# Patient Record
Sex: Male | Born: 1965 | Race: White | Hispanic: No | Marital: Married | Smoking: Never smoker
Health system: Southern US, Community
[De-identification: ages and names within clinical notes are randomized; demographics above are authoritative.]

## PROBLEM LIST (undated history)

## (undated) DIAGNOSIS — I10 Essential (primary) hypertension: Secondary | ICD-10-CM

## (undated) DIAGNOSIS — E039 Hypothyroidism, unspecified: Secondary | ICD-10-CM

## (undated) DIAGNOSIS — G4730 Sleep apnea, unspecified: Secondary | ICD-10-CM

## (undated) DIAGNOSIS — E785 Hyperlipidemia, unspecified: Secondary | ICD-10-CM

## (undated) DIAGNOSIS — J329 Chronic sinusitis, unspecified: Secondary | ICD-10-CM

## (undated) DIAGNOSIS — I080 Rheumatic disorders of both mitral and aortic valves: Secondary | ICD-10-CM

## (undated) DIAGNOSIS — D239 Other benign neoplasm of skin, unspecified: Secondary | ICD-10-CM

## (undated) DIAGNOSIS — I341 Nonrheumatic mitral (valve) prolapse: Secondary | ICD-10-CM

## (undated) DIAGNOSIS — L509 Urticaria, unspecified: Secondary | ICD-10-CM

## (undated) DIAGNOSIS — E0500 Thyrotoxicosis with diffuse goiter without thyrotoxic crisis or storm: Secondary | ICD-10-CM

## (undated) HISTORY — PX: NASAL SINUS SURGERY: SHX719

## (undated) HISTORY — PX: OTHER SURGICAL HISTORY: SHX169

---

## 2010-02-22 ENCOUNTER — Ambulatory Visit: Payer: Self-pay | Admitting: Otolaryngology

## 2016-04-14 ENCOUNTER — Other Ambulatory Visit: Payer: Self-pay | Admitting: Internal Medicine

## 2016-04-14 DIAGNOSIS — R1310 Dysphagia, unspecified: Secondary | ICD-10-CM

## 2016-10-17 ENCOUNTER — Other Ambulatory Visit: Payer: Self-pay | Admitting: Internal Medicine

## 2016-10-17 DIAGNOSIS — R1310 Dysphagia, unspecified: Secondary | ICD-10-CM

## 2016-11-10 ENCOUNTER — Ambulatory Visit
Admission: RE | Admit: 2016-11-10 | Discharge: 2016-11-10 | Disposition: A | Discharge status: Home / Self Care | Payer: 59 | Source: Ambulatory Visit | Attending: Internal Medicine | Admitting: Internal Medicine

## 2016-11-10 DIAGNOSIS — K449 Diaphragmatic hernia without obstruction or gangrene: Secondary | ICD-10-CM | POA: Insufficient documentation

## 2016-11-10 DIAGNOSIS — R1310 Dysphagia, unspecified: Secondary | ICD-10-CM | POA: Diagnosis not present

## 2017-03-15 ENCOUNTER — Encounter: Payer: Self-pay | Admitting: *Deleted

## 2017-03-16 ENCOUNTER — Encounter
Admission: RE | Disposition: A | Discharge status: Home / Self Care | Payer: Self-pay | Source: Ambulatory Visit | Attending: Gastroenterology

## 2017-03-16 ENCOUNTER — Encounter: Payer: Self-pay | Admitting: *Deleted

## 2017-03-16 ENCOUNTER — Ambulatory Visit
Admission: RE | Admit: 2017-03-16 | Discharge: 2017-03-16 | Disposition: A | Discharge status: Home / Self Care | Payer: Managed Care, Other (non HMO) | Source: Ambulatory Visit | Attending: Gastroenterology | Admitting: Gastroenterology

## 2017-03-16 ENCOUNTER — Ambulatory Visit: Payer: Managed Care, Other (non HMO) | Admitting: Anesthesiology

## 2017-03-16 DIAGNOSIS — R1310 Dysphagia, unspecified: Secondary | ICD-10-CM | POA: Insufficient documentation

## 2017-03-16 DIAGNOSIS — I10 Essential (primary) hypertension: Secondary | ICD-10-CM | POA: Diagnosis not present

## 2017-03-16 DIAGNOSIS — Z79899 Other long term (current) drug therapy: Secondary | ICD-10-CM | POA: Insufficient documentation

## 2017-03-16 DIAGNOSIS — E0500 Thyrotoxicosis with diffuse goiter without thyrotoxic crisis or storm: Secondary | ICD-10-CM | POA: Insufficient documentation

## 2017-03-16 DIAGNOSIS — E039 Hypothyroidism, unspecified: Secondary | ICD-10-CM | POA: Insufficient documentation

## 2017-03-16 DIAGNOSIS — Z1211 Encounter for screening for malignant neoplasm of colon: Secondary | ICD-10-CM | POA: Diagnosis not present

## 2017-03-16 DIAGNOSIS — Z7952 Long term (current) use of systemic steroids: Secondary | ICD-10-CM | POA: Diagnosis not present

## 2017-03-16 DIAGNOSIS — K449 Diaphragmatic hernia without obstruction or gangrene: Secondary | ICD-10-CM | POA: Insufficient documentation

## 2017-03-16 DIAGNOSIS — K5730 Diverticulosis of large intestine without perforation or abscess without bleeding: Secondary | ICD-10-CM | POA: Diagnosis not present

## 2017-03-16 DIAGNOSIS — I341 Nonrheumatic mitral (valve) prolapse: Secondary | ICD-10-CM | POA: Insufficient documentation

## 2017-03-16 HISTORY — DX: Other benign neoplasm of skin, unspecified: D23.9

## 2017-03-16 HISTORY — DX: Nonrheumatic mitral (valve) prolapse: I34.1

## 2017-03-16 HISTORY — DX: Hyperlipidemia, unspecified: E78.5

## 2017-03-16 HISTORY — DX: Thyrotoxicosis with diffuse goiter without thyrotoxic crisis or storm: E05.00

## 2017-03-16 HISTORY — PX: ESOPHAGOGASTRODUODENOSCOPY: SHX5428

## 2017-03-16 HISTORY — DX: Essential (primary) hypertension: I10

## 2017-03-16 HISTORY — DX: Hypothyroidism, unspecified: E03.9

## 2017-03-16 HISTORY — DX: Chronic sinusitis, unspecified: J32.9

## 2017-03-16 HISTORY — DX: Urticaria, unspecified: L50.9

## 2017-03-16 HISTORY — DX: Rheumatic disorders of both mitral and aortic valves: I08.0

## 2017-03-16 HISTORY — PX: COLONOSCOPY: SHX5424

## 2017-03-16 SURGERY — COLONOSCOPY
Anesthesia: General

## 2017-03-16 MED ORDER — GLYCOPYRROLATE 0.2 MG/ML IJ SOLN
INTRAMUSCULAR | Status: DC | PRN
  Administered 2017-03-16 (×2): 0.2 mg via INTRAVENOUS

## 2017-03-16 MED ORDER — SODIUM CHLORIDE 0.9 % IV SOLN: INTRAVENOUS | Status: DC

## 2017-03-16 MED ORDER — SODIUM CHLORIDE 0.9 % IV SOLN
INTRAVENOUS | Status: DC
  Administered 2017-03-16: 1000 mL via INTRAVENOUS

## 2017-03-16 MED ORDER — PROPOFOL 500 MG/50ML IV EMUL
INTRAVENOUS | Status: DC | PRN
  Administered 2017-03-16: 200 ug/kg/min via INTRAVENOUS

## 2017-03-16 MED ORDER — LIDOCAINE 2% (20 MG/ML) 5 ML SYRINGE
INTRAMUSCULAR | Status: DC | PRN
  Administered 2017-03-16: 100 mg via INTRAVENOUS

## 2017-03-16 MED ORDER — PROPOFOL 10 MG/ML IV BOLUS
INTRAVENOUS | Status: DC | PRN
  Administered 2017-03-16: 100 mg via INTRAVENOUS

## 2017-03-16 MED ORDER — PROPOFOL 500 MG/50ML IV EMUL
INTRAVENOUS | Status: AC
  Filled 2017-03-16: qty 50

## 2017-03-16 MED ORDER — FENTANYL CITRATE (PF) 100 MCG/2ML IJ SOLN: 25.0000 ug | INTRAMUSCULAR | Status: DC | PRN

## 2017-03-16 MED ORDER — PHENYLEPHRINE HCL 10 MG/ML IJ SOLN
INTRAMUSCULAR | Status: DC | PRN
  Administered 2017-03-16: 50 ug via INTRAVENOUS

## 2017-03-16 MED ORDER — ONDANSETRON HCL 4 MG/2ML IJ SOLN: 4.0000 mg | Freq: Once | INTRAMUSCULAR | Status: DC | PRN

## 2017-03-16 NOTE — Op Note (Signed)
[facility] Gastroenterology Patient Name: Jose Black Procedure Date: 03/16/2017 12:22 PM MRN: [id number] Account #: [id number] Date of Birth: 1966/02/10 Admit Type: Outpatient Age: 51 Room: [facility] ENDO ROOM 3 Gender: Male Note Status: Finalized Procedure:            Colonoscopy Indications:          Screening for colorectal malignant neoplasm Providers:            Lollie Sails, MD Referring MD:         Leonie Douglas. Doy Hutching, MD (Referring MD) Medicines:            Monitored Anesthesia Care Complications:        No immediate complications. Procedure:            Pre-Anesthesia Assessment:                       - ASA Grade Assessment: III - A patient with severe                        systemic disease.                       After obtaining informed consent, the colonoscope was                        passed under direct vision. Throughout the procedure,                        the patient's blood pressure, pulse, and oxygen                        saturations were monitored continuously. The                        Colonoscope was introduced through the anus and                        advanced to the the cecum, identified by appendiceal                        orifice and ileocecal valve. The colonoscopy was                        performed with moderate difficulty due to poor                        endoscopic visualization and a tortuous colon.                        Successful completion of the procedure was aided by                        changing the patient to a supine position, changing the                        patient to a prone position and lavage. The patient                        tolerated the procedure well. The quality of the bowel  preparation was fair. Findings:      Multiple small-mouthed diverticula were found in the sigmoid colon and       descending colon.      The digital rectal exam was normal.      The exam was  otherwise without abnormality. Impression:           - Preparation of the colon was fair.                       - Diverticulosis in the sigmoid colon and in the                        descending colon.                       - The examination was otherwise normal.                       - No specimens collected. Recommendation:       - Repeat colonoscopy in 10 years for screening purposes. Procedure Code(s):    --- Professional ---                       [phone], Colonoscopy, flexible; diagnostic, including                        collection of specimen(s) by brushing or washing, when                        performed (separate procedure) Diagnosis Code(s):    --- Professional ---                       Z12.11, Encounter for screening for malignant neoplasm                        of colon                       K57.30, Diverticulosis of large intestine without                        perforation or abscess without bleeding CPT copyright 2016 American [employer]. All rights reserved. The codes documented in this report are preliminary and upon coder review may  be revised to meet current compliance requirements. Lollie Sails, MD 03/16/2017 1:26:44 PM This report has been signed electronically. Number of Addenda: 0 Note Initiated On: 03/16/2017 12:22 PM Scope Withdrawal Time: 0 hours 11 minutes 36 seconds  Total Procedure Duration: 0 hours 40 minutes 55 seconds       [facility]

## 2017-03-16 NOTE — Transfer of Care (Signed)
Immediate Anesthesia Transfer of Care Note  Patient: Jose Black  Procedure(s) Performed: Procedure(s): COLONOSCOPY (N/A) ESOPHAGOGASTRODUODENOSCOPY (EGD) (N/A)  Patient Location: Endoscopy Unit  Anesthesia Type:General  Level of Consciousness: sedated  Airway & Oxygen Therapy: Patient connected to face mask oxygen  Post-op Assessment: Post -op Vital signs reviewed and stable  Post vital signs: stable  Last Vitals:  Vitals:   03/16/17 1143 03/16/17 1344  BP:  112/85  Pulse: 66 85  Resp: 20 12  Temp: 36.4 C (!) 35.9 C    Last Pain:  Vitals:   03/16/17 1344  TempSrc: Tympanic      Patients Stated Pain Goal: 0 (28/83/37 4451)  Complications: No apparent anesthesia complications

## 2017-03-16 NOTE — Anesthesia Post-op Follow-up Note (Cosign Needed)
Anesthesia QCDR form completed.        

## 2017-03-16 NOTE — H&P (Signed)
Outpatient short stay form Pre-procedure 03/16/2017 11:57 AM Lollie Sails MD  Primary Physician: Dr. Fulton Reek  Reason for visit:  Dysphagia, abnormal barium swallow, colon cancer screening.  History of present illness:  Patient is a 51 year old male presenting today as above. Patient has a long-term history of some mild intermittent dysphagia. He says "30 years". He will at times regurgitate foods. He had a barium swallow on 11/10/2016 showing a slight delay of passage in the standard barium tablet just above a hiatal hernia. Also tertiary contractions noted. As a small sliding hiatal hernia. He tolerated his prep well. He is also undergoing a screening colonoscopy today. This is his first colonoscopy. He takes no aspirin or blood thinning agents. Patient does have a history of sleep apnea and uses a CPAP.    Current Facility-Administered Medications:  .  0.9 %  sodium chloride infusion, , Intravenous, Continuous, Lollie Sails, MD, Last Rate: 20 mL/hr at 03/16/17 1155, 1,000 mL at 03/16/17 1155 .  0.9 %  sodium chloride infusion, , Intravenous, Continuous, Lollie Sails, MD .  fentaNYL (SUBLIMAZE) injection 25 mcg, 25 mcg, Intravenous, Q5 min PRN, Alvin Critchley, MD .  ondansetron [facility]) injection 4 mg, 4 mg, Intravenous, Once PRN, Alvin Critchley, MD  Prescriptions Prior to Admission  Medication Sig Dispense Refill Last Dose  . albuterol (PROVENTIL HFA;VENTOLIN HFA) 108 (90 Base) MCG/ACT inhaler Inhale into the lungs every 6 (six) hours as needed for wheezing or shortness of breath.     . levothyroxine (SYNTHROID, LEVOTHROID) 200 MCG tablet Take 200 mcg by mouth daily before breakfast.     . predniSONE (DELTASONE) 10 MG tablet Take 10 mg by mouth daily with breakfast.        No Known Allergies   Past Medical History:  Diagnosis Date  . Dysplastic nevi   . Elevated lipids   . Graves disease   . Hypertension   . Hypothyroidism   . Mitral and aortic regurgitation    . MVP (mitral valve prolapse)   . Sinusitis   . Urticaria     Review of systems:      Physical Exam    Heart and lungs: Regular rate and rhythm without rub or gallop, lungs are bilaterally clear.    HEENT: Normocephalic atraumatic eyes are anicteric    Other:     Pertinant exam for procedure: Soft nontender nondistended bowel sounds positive normoactive.    Planned proceedures: EGD, colonoscopy and indicated procedures. I have discussed the risks benefits and complications of procedures to include not limited to bleeding, infection, perforation and the risk of sedation and the patient wishes to proceed.    Lollie Sails, MD Gastroenterology 03/16/2017  11:57 AM

## 2017-03-16 NOTE — Anesthesia Preprocedure Evaluation (Addendum)
Anesthesia Evaluation  Patient identified by MRN, date of birth, ID band Patient awake    Reviewed: Allergy & Precautions, NPO status , Patient's Chart, lab work & pertinent test results  Airway Mallampati: II  TM Distance: <3 FB     Dental  (+) Chipped   Pulmonary neg pulmonary ROS,    Pulmonary exam normal        Cardiovascular hypertension, Normal cardiovascular exam+ Valvular Problems/Murmurs MVP, MR and AI      Neuro/Psych negative neurological ROS  negative psych ROS   GI/Hepatic negative GI ROS, Neg liver ROS,   Endo/Other  Hypothyroidism Hyperthyroidism   Renal/GU negative Renal ROS     Musculoskeletal negative musculoskeletal ROS (+)   Abdominal Normal abdominal exam  (+)   Peds  Hematology negative hematology ROS (+)   Anesthesia Other Findings   Reproductive/Obstetrics                            Anesthesia Physical Anesthesia Plan  ASA: III  Anesthesia Plan: General   Post-op Pain Management:    Induction: Intravenous  Airway Management Planned: Nasal Cannula  Additional Equipment:   Intra-op Plan:   Post-operative Plan:   Informed Consent: I have reviewed the patients History and Physical, chart, labs and discussed the procedure including the risks, benefits and alternatives for the proposed anesthesia with the patient or authorized representative who has indicated his/her understanding and acceptance.   Dental advisory given  Plan Discussed with: CRNA and Surgeon  Anesthesia Plan Comments:         Anesthesia Quick Evaluation

## 2017-03-16 NOTE — Op Note (Signed)
[facility] Gastroenterology Patient Name: Jose Black Procedure Date: 03/16/2017 12:23 PM MRN: [id number] Account #: [id number] Date of Birth: 10-Nov-1965 Admit Type: Outpatient Age: 51 Room: [facility] ENDO ROOM 3 Gender: Male Note Status: Finalized Procedure:            Upper GI endoscopy Indications:          Dysphagia Providers:            Lollie Sails, MD Referring MD:         Leonie Douglas. Doy Hutching, MD (Referring MD) Medicines:            Monitored Anesthesia Care Complications:        No immediate complications. Procedure:            Pre-Anesthesia Assessment:                       - ASA Grade Assessment: III - A patient with severe                        systemic disease.                       After obtaining informed consent, the endoscope was                        passed under direct vision. Throughout the procedure,                        the patient's blood pressure, pulse, and oxygen                        saturations were monitored continuously. The procedure                        was aborted. The scope was not inserted. Medications                        were given. The upper GI endoscopy was extremely                        difficult due to patient intolerance of esophageal                        intubation. Findings:      Initial attempt at esophageal intubation was unsuccessful due to hypoxia       and intolerance of intubation. There was noted a n amount of mucousy       material coughed out of the fairway. I decided to do colonoscopy and       return to the EGD. On the second attempt, he again did not tolerate       placement of the scope with coughing, mucous material and I aborted due       to concern. Impression:           - No specimens collected. Recommendation:       - Discharge patient to home.                       - recommend follow up with Pulmonogy/PMD for possible  treatment of pulmonary secretions,  reschedule with                        consideration of intubation for the procedure. Will                        place on a ppi, pantoprazole 40 mg daily and follow up                        in the office before rescheduling. Diagnosis Code(s):    --- Professional ---                       R13.10, Dysphagia, unspecified Lollie Sails, MD 03/16/2017 1:44:29 PM This report has been signed electronically. Number of Addenda: 0 Note Initiated On: 03/16/2017 12:23 PM      [facility]

## 2017-03-16 NOTE — Anesthesia Postprocedure Evaluation (Signed)
Anesthesia Post Note  Patient: CHEY CHO  Procedure(s) Performed: Procedure(s) (LRB): COLONOSCOPY (N/A) ESOPHAGOGASTRODUODENOSCOPY (EGD) (N/A)  Patient location during evaluation: PACU Anesthesia Type: General Level of consciousness: awake and alert and oriented Pain management: pain level controlled Vital Signs Assessment: post-procedure vital signs reviewed and stable Respiratory status: spontaneous breathing Cardiovascular status: blood pressure returned to baseline Anesthetic complications: no     Last Vitals:  Vitals:   03/16/17 1414 03/16/17 1424  BP: (!) 96/92   Pulse: 79 78  Resp: 15 (!) 21  Temp:      Last Pain:  Vitals:   03/16/17 1344  TempSrc: Tympanic                 Mika Anastasi

## 2017-03-22 ENCOUNTER — Encounter: Payer: Self-pay | Admitting: Gastroenterology

## 2017-05-23 ENCOUNTER — Encounter: Payer: Self-pay | Admitting: *Deleted

## 2017-05-24 ENCOUNTER — Encounter
Admission: RE | Disposition: A | Discharge status: Home / Self Care | Payer: Self-pay | Source: Ambulatory Visit | Attending: Gastroenterology

## 2017-05-24 ENCOUNTER — Ambulatory Visit
Admission: RE | Admit: 2017-05-24 | Discharge: 2017-05-24 | Disposition: A | Discharge status: Home / Self Care | Payer: Managed Care, Other (non HMO) | Source: Ambulatory Visit | Attending: Gastroenterology | Admitting: Gastroenterology

## 2017-05-24 ENCOUNTER — Encounter: Payer: Self-pay | Admitting: *Deleted

## 2017-05-24 ENCOUNTER — Ambulatory Visit: Payer: Managed Care, Other (non HMO) | Admitting: Anesthesiology

## 2017-05-24 DIAGNOSIS — K2210 Ulcer of esophagus without bleeding: Secondary | ICD-10-CM | POA: Diagnosis not present

## 2017-05-24 DIAGNOSIS — K228 Other specified diseases of esophagus: Secondary | ICD-10-CM | POA: Insufficient documentation

## 2017-05-24 DIAGNOSIS — K2980 Duodenitis without bleeding: Secondary | ICD-10-CM | POA: Insufficient documentation

## 2017-05-24 DIAGNOSIS — G4730 Sleep apnea, unspecified: Secondary | ICD-10-CM | POA: Insufficient documentation

## 2017-05-24 DIAGNOSIS — K219 Gastro-esophageal reflux disease without esophagitis: Secondary | ICD-10-CM | POA: Insufficient documentation

## 2017-05-24 DIAGNOSIS — K224 Dyskinesia of esophagus: Secondary | ICD-10-CM | POA: Insufficient documentation

## 2017-05-24 DIAGNOSIS — I10 Essential (primary) hypertension: Secondary | ICD-10-CM | POA: Diagnosis not present

## 2017-05-24 DIAGNOSIS — K317 Polyp of stomach and duodenum: Secondary | ICD-10-CM | POA: Insufficient documentation

## 2017-05-24 DIAGNOSIS — E0500 Thyrotoxicosis with diffuse goiter without thyrotoxic crisis or storm: Secondary | ICD-10-CM | POA: Insufficient documentation

## 2017-05-24 DIAGNOSIS — J45909 Unspecified asthma, uncomplicated: Secondary | ICD-10-CM | POA: Diagnosis not present

## 2017-05-24 DIAGNOSIS — K259 Gastric ulcer, unspecified as acute or chronic, without hemorrhage or perforation: Secondary | ICD-10-CM | POA: Diagnosis not present

## 2017-05-24 DIAGNOSIS — I080 Rheumatic disorders of both mitral and aortic valves: Secondary | ICD-10-CM | POA: Insufficient documentation

## 2017-05-24 DIAGNOSIS — E039 Hypothyroidism, unspecified: Secondary | ICD-10-CM | POA: Insufficient documentation

## 2017-05-24 DIAGNOSIS — Z79899 Other long term (current) drug therapy: Secondary | ICD-10-CM | POA: Insufficient documentation

## 2017-05-24 DIAGNOSIS — R1310 Dysphagia, unspecified: Secondary | ICD-10-CM | POA: Diagnosis present

## 2017-05-24 HISTORY — DX: Sleep apnea, unspecified: G47.30

## 2017-05-24 HISTORY — PX: ESOPHAGOGASTRODUODENOSCOPY (EGD) WITH PROPOFOL: SHX5813

## 2017-05-24 SURGERY — ESOPHAGOGASTRODUODENOSCOPY (EGD) WITH PROPOFOL
Anesthesia: General

## 2017-05-24 MED ORDER — SODIUM CHLORIDE 0.9 % IV SOLN: INTRAVENOUS | Status: DC

## 2017-05-24 MED ORDER — PROPOFOL 500 MG/50ML IV EMUL
INTRAVENOUS | Status: DC | PRN
  Administered 2017-05-24: 160 ug/kg/min via INTRAVENOUS

## 2017-05-24 MED ORDER — IPRATROPIUM-ALBUTEROL 0.5-2.5 (3) MG/3ML IN SOLN
3.0000 mL | Freq: Once | RESPIRATORY_TRACT | Status: AC
  Administered 2017-05-24: 3 mL via RESPIRATORY_TRACT

## 2017-05-24 MED ORDER — FENTANYL CITRATE (PF) 100 MCG/2ML IJ SOLN
INTRAMUSCULAR | Status: AC
  Filled 2017-05-24: qty 2

## 2017-05-24 MED ORDER — LIDOCAINE 2% (20 MG/ML) 5 ML SYRINGE
INTRAMUSCULAR | Status: DC | PRN
  Administered 2017-05-24: 40 mg via INTRAVENOUS

## 2017-05-24 MED ORDER — PROPOFOL 500 MG/50ML IV EMUL
INTRAVENOUS | Status: AC
  Filled 2017-05-24: qty 50

## 2017-05-24 MED ORDER — MIDAZOLAM HCL 5 MG/5ML IJ SOLN
INTRAMUSCULAR | Status: DC | PRN
  Administered 2017-05-24 (×2): 1 mg via INTRAVENOUS

## 2017-05-24 MED ORDER — MIDAZOLAM HCL 2 MG/2ML IJ SOLN
INTRAMUSCULAR | Status: AC
  Filled 2017-05-24: qty 2

## 2017-05-24 MED ORDER — IPRATROPIUM-ALBUTEROL 0.5-2.5 (3) MG/3ML IN SOLN
RESPIRATORY_TRACT | Status: AC
  Filled 2017-05-24: qty 3

## 2017-05-24 MED ORDER — PROPOFOL 10 MG/ML IV BOLUS
INTRAVENOUS | Status: DC | PRN
  Administered 2017-05-24: 50 mg via INTRAVENOUS
  Administered 2017-05-24: 100 mg via INTRAVENOUS

## 2017-05-24 MED ORDER — FENTANYL CITRATE (PF) 100 MCG/2ML IJ SOLN
INTRAMUSCULAR | Status: DC | PRN
  Administered 2017-05-24 (×2): 50 ug via INTRAVENOUS

## 2017-05-24 MED ORDER — SODIUM CHLORIDE 0.9 % IV SOLN
INTRAVENOUS | Status: DC
  Administered 2017-05-24 (×2): via INTRAVENOUS

## 2017-05-24 NOTE — Op Note (Signed)
[facility] Gastroenterology Patient Name: Jose Black Procedure Date: 05/24/2017 10:54 AM MRN: [id number] Account #: [id number] Date of Birth: [birth year] Admit Type: Outpatient Age: 51 Room: [facility] ENDO ROOM 1 Gender: Male Note Status: Finalized Procedure:            Upper GI endoscopy Indications:          Dysphagia Providers:            Lollie Sails, MD Referring MD:         Leonie Douglas. Doy Hutching, MD (Referring MD) Medicines:            Monitored Anesthesia Care Complications:        No immediate complications. Procedure:            Pre-Anesthesia Assessment:                       - ASA Grade Assessment: III - A patient with severe                        systemic disease.                       After obtaining informed consent, the endoscope was                        passed under direct vision. Throughout the procedure,                        the patient's blood pressure, pulse, and oxygen                        saturations were monitored continuously. The Endoscope                        was introduced through the mouth, and advanced to the                        third part of duodenum. The upper GI endoscopy was                        accomplished without difficulty. The patient tolerated                        the procedure well. Findings:      Abnormal motility was noted in the upper third of the esophagus, in the       middle third of the esophagus and in the lower third of the esophagus.       The cricopharyngeus was normal. There are extra peristaltic waves in the       esophageal body. Tertiary peristaltic waves are noted. Question of       possible eosinophillic esophagitis versus dysmotility/feline appearance       with intermittant ringed appearance. Non-obstructing. Biopsies were       taken with a cold forceps for histology from about 26-28 cm.      A single 2 mm polyp with no bleeding was found 28 cm from the incisors.       The polyp was  removed with a cold biopsy forceps. Resection and       retrieval were complete.      LA Grade D (one or more  mucosal breaks involving at least 75% of       esophageal circumference) esophagitis with no bleeding was found.       Biopsies were taken with a cold forceps for histology.      One non-bleeding superficial gastric erosion/ulcer with no stigmata of       bleeding was found in the prepyloric region of the stomach. The lesion       was 4 mm in largest dimension.      Biopsies were taken with a cold forceps in the gastric body and in the       gastric antrum for Helicobacter pylori testing.      The cardia and gastric fundus were normal on retroflexion.      Patchy mild inflammation characterized by congestion (edema) and       erythema was found in the duodenal bulb and in the second portion of the       duodenum.      A single 3 mm sessile polyp with no bleeding and no stigmata of recent       bleeding was found on the anterior wall of the gastric body. The polyp       was removed with a cold biopsy forceps. Resection and retrieval were       complete. Impression:           - Abnormal esophageal motility. Biopsied.                       - Esophageal polyp(s) were found. Resected and                        retrieved.                       - LA Grade D erosive esophagitis. Biopsied.                       - Non-bleeding gastric ulcer with no stigmata of                        bleeding.                       - Duodenitis.                       - A single gastric polyp. Resected and retrieved.                       - Biopsies were taken with a cold forceps for                        Helicobacter pylori testing. Recommendation:       - Use Protonix (pantoprazole) 40 mg PO BID for 6 weeks.                       - Use Protonix (pantoprazole) 40 mg PO daily.                       - Return to GI clinic in 4 weeks.                       - Repeat upper endoscopy in 8 weeks to evaluate the  response to therapy. Procedure Code(s):    --- Professional ---                       [phone], Esophagogastroduodenoscopy, flexible, transoral;                        with biopsy, single or multiple Diagnosis Code(s):    --- Professional ---                       K22.4, Dyskinesia of esophagus                       K22.8, Other specified diseases of esophagus                       K20.8, Other esophagitis                       K25.9, Gastric ulcer, unspecified as acute or chronic,                        without hemorrhage or perforation                       K29.80, Duodenitis without bleeding                       K31.7, Polyp of stomach and duodenum                       R13.10, Dysphagia, unspecified CPT copyright 2016 American Medical Association. All rights reserved. The codes documented in this report are preliminary and upon coder review may  be revised to meet current compliance requirements. Lollie Sails, MD 05/24/2017 11:42:03 AM This report has been signed electronically. Number of Addenda: 0 Note Initiated On: 05/24/2017 10:54 AM      [facility]

## 2017-05-24 NOTE — Anesthesia Preprocedure Evaluation (Signed)
Anesthesia Evaluation  Patient identified by MRN, date of birth, ID band Patient awake    Reviewed: Allergy & Precautions, H&P , NPO status , Patient's Chart, lab work & pertinent test results  Airway Mallampati: III  TM Distance: >3 FB Neck ROM: full    Dental  (+) Poor Dentition, Chipped   Pulmonary asthma , sleep apnea ,    breath sounds clear to auscultation       Cardiovascular Exercise Tolerance: Good hypertension,      Neuro/Psych negative neurological ROS  negative psych ROS   GI/Hepatic negative GI ROS, Neg liver ROS, neg GERD  ,  Endo/Other    Renal/GU negative Renal ROS  negative genitourinary   Musculoskeletal   Abdominal   Peds  Hematology negative hematology ROS (+)   Anesthesia Other Findings Past Medical History: No date: Dysplastic nevi No date: Elevated lipids No date: Graves disease No date: Graves disease No date: Hypertension No date: Hypothyroidism No date: Mitral and aortic regurgitation No date: MVP (mitral valve prolapse) No date: Sinusitis No date: Sleep apnea     Comment:  CPAP at home No date: Urticaria  Past Surgical History: 03/16/2017: COLONOSCOPY; N/A     Comment:  Procedure: COLONOSCOPY;  Surgeon: Lollie Sails,               MD;  Location: [facility] ENDOSCOPY;  Service: Endoscopy;                Laterality: N/A; 03/16/2017: ESOPHAGOGASTRODUODENOSCOPY; N/A     Comment:  Procedure: ESOPHAGOGASTRODUODENOSCOPY (EGD);  Surgeon:               Lollie Sails, MD;  Location: [facility] ENDOSCOPY;                Service: Endoscopy;  Laterality: N/A; No date: iodine ablation No date: NASAL SINUS SURGERY  BMI    Body Mass Index:  33.23 kg/m      Reproductive/Obstetrics negative OB ROS                             Anesthesia Physical Anesthesia Plan  ASA: III  Anesthesia Plan: General   Post-op Pain Management:    Induction: Intravenous  PONV  Risk Score and Plan:   Airway Management Planned: Natural Airway and Nasal Cannula  Additional Equipment:   Intra-op Plan:   Post-operative Plan:   Informed Consent: I have reviewed the patients History and Physical, chart, labs and discussed the procedure including the risks, benefits and alternatives for the proposed anesthesia with the patient or authorized representative who has indicated his/her understanding and acceptance.   Dental Advisory Given  Plan Discussed with: Anesthesiologist, CRNA and Surgeon  Anesthesia Plan Comments: (Patient consented for risks of anesthesia including but not limited to:  - adverse reactions to medications - risk of intubation if required - damage to teeth, lips or other oral mucosa - sore throat or hoarseness - Damage to heart, brain, lungs or loss of life  Patient voiced understanding.)        Anesthesia Quick Evaluation

## 2017-05-24 NOTE — Transfer of Care (Signed)
Immediate Anesthesia Transfer of Care Note  Patient: Jose Black  Procedure(s) Performed: Procedure(s): ESOPHAGOGASTRODUODENOSCOPY (EGD) WITH PROPOFOL (N/A)  Patient Location: PACU and Endoscopy Unit  Anesthesia Type:General  Level of Consciousness: awake, sedated, drowsy and patient cooperative  Airway & Oxygen Therapy: Patient Spontanous Breathing and Patient connected to nasal cannula oxygen  Post-op Assessment: Report given to RN and Post -op Vital signs reviewed and stable  Post vital signs: Reviewed and stable  Last Vitals:  Vitals:   05/24/17 0956  BP: (!) 142/83  Pulse: 65  Resp: 16  Temp: 36.6 C    Last Pain:  Vitals:   05/24/17 0956  TempSrc: Tympanic         Complications: No apparent anesthesia complications

## 2017-05-24 NOTE — H&P (Addendum)
Outpatient short stay form Pre-procedure 05/24/2017 10:52 AM Lollie Sails MD  Primary Physician: Dr. Fulton Reek  Reason for visit:  EGD  History of present illness:  Patient is a 51 year old male presenting today as above. He has personal history of some difficulty with dysphagia. He had a barium swallow indicating a possible narrowing toward the bottom of the esophagus. He is not on a proton pump inhibitor. He does not manually regurgitate foods. He takes no aspirin products or blood thinning agents.  He did have an attempted EGD on 03/16/2017 however he did not tolerate the intubation at that time with hypoxia. In review that time he did have a relatively recent history of a upper respiratory tract infection. This has been cleared at this point.    Current Facility-Administered Medications:  .  0.9 %  sodium chloride infusion, , Intravenous, Continuous, Lollie Sails, MD, Last Rate: 20 mL/hr at 05/24/17 1013 .  0.9 %  sodium chloride infusion, , Intravenous, Continuous, Lollie Sails, MD .  ipratropium-albuterol (DUONEB) 0.5-2.5 (3) MG/3ML nebulizer solution, , , ,   Prescriptions Prior to Admission  Medication Sig Dispense Refill Last Dose  . levothyroxine (SYNTHROID, LEVOTHROID) 200 MCG tablet Take 200 mcg by mouth daily before breakfast.   05/23/2017 at Unknown time  . albuterol (PROVENTIL HFA;VENTOLIN HFA) 108 (90 Base) MCG/ACT inhaler Inhale into the lungs every 6 (six) hours as needed for wheezing or shortness of breath.     . predniSONE (DELTASONE) 10 MG tablet Take 10 mg by mouth daily with breakfast.   05/21/2017     No Known Allergies   Past Medical History:  Diagnosis Date  . Dysplastic nevi   . Elevated lipids   . Graves disease   . Graves disease   . Hypertension   . Hypothyroidism   . Mitral and aortic regurgitation   . MVP (mitral valve prolapse)   . Sinusitis   . Sleep apnea    CPAP at home  . Urticaria     Review of systems:       Physical Exam    Heart and lungs: Regular rate and rhythm without rub or gallop, lungs are bilaterally clear.    HEENT: Normocephalic atraumatic eyes are anicteric    Other:     Pertinant exam for procedure: Soft nontender nondistended bowel sounds positive normoactive.    Planned proceedures: EGD and indicated procedures. I have discussed the risks benefits and complications of procedures to include not limited to bleeding, infection, perforation and the risk of sedation and the patient wishes to proceed.    Lollie Sails, MD Gastroenterology 05/24/2017  10:52 AM

## 2017-05-24 NOTE — Anesthesia Post-op Follow-up Note (Cosign Needed)
Anesthesia QCDR form completed.        

## 2017-05-25 ENCOUNTER — Encounter: Payer: Self-pay | Admitting: Gastroenterology

## 2017-05-25 NOTE — Anesthesia Postprocedure Evaluation (Signed)
Anesthesia Post Note  Patient: Jose Black  Procedure(s) Performed: Procedure(s) (LRB): ESOPHAGOGASTRODUODENOSCOPY (EGD) WITH PROPOFOL (N/A)  Patient location during evaluation: Endoscopy Anesthesia Type: General Level of consciousness: awake and alert Pain management: pain level controlled Vital Signs Assessment: post-procedure vital signs reviewed and stable Respiratory status: spontaneous breathing, nonlabored ventilation, respiratory function stable and patient connected to nasal cannula oxygen Cardiovascular status: blood pressure returned to baseline and stable Postop Assessment: no signs of nausea or vomiting Anesthetic complications: no     Last Vitals:  Vitals:   05/24/17 1155 05/24/17 1205  BP: 130/85 135/88  Pulse: 84 74  Resp: (!) 21 18  Temp:      Last Pain:  Vitals:   05/24/17 1135  TempSrc: Tympanic                 Precious Haws Zerah Hilyer

## 2017-05-26 LAB — SURGICAL PATHOLOGY

## 2017-09-13 ENCOUNTER — Ambulatory Visit
Admission: RE | Admit: 2017-09-13 | Payer: Managed Care, Other (non HMO) | Source: Ambulatory Visit | Admitting: Gastroenterology

## 2017-09-13 ENCOUNTER — Encounter: Payer: Self-pay | Admitting: Anesthesiology

## 2017-09-13 ENCOUNTER — Encounter: Admission: RE | Payer: Self-pay | Source: Ambulatory Visit

## 2017-09-13 SURGERY — ESOPHAGOGASTRODUODENOSCOPY (EGD) WITH PROPOFOL
Anesthesia: General

## 2017-09-13 MED ORDER — PROPOFOL 500 MG/50ML IV EMUL
INTRAVENOUS | Status: AC
Start: 2017-09-13 — End: 2017-09-13
  Filled 2017-09-13: qty 50

## 2017-09-13 MED ORDER — FENTANYL CITRATE (PF) 100 MCG/2ML IJ SOLN
INTRAMUSCULAR | Status: AC
  Filled 2017-09-13: qty 2

## 2019-12-24 ENCOUNTER — Other Ambulatory Visit: Payer: Self-pay | Admitting: Internal Medicine

## 2019-12-24 DIAGNOSIS — K209 Esophagitis, unspecified without bleeding: Secondary | ICD-10-CM

## 2020-01-21 ENCOUNTER — Ambulatory Visit
Admission: RE | Admit: 2020-01-21 | Discharge: 2020-01-21 | Disposition: A | Discharge status: Home / Self Care | Payer: Managed Care, Other (non HMO) | Source: Ambulatory Visit | Attending: Internal Medicine | Admitting: Internal Medicine

## 2020-01-21 ENCOUNTER — Other Ambulatory Visit: Payer: Self-pay

## 2020-01-21 DIAGNOSIS — K209 Esophagitis, unspecified without bleeding: Secondary | ICD-10-CM | POA: Diagnosis present

## 2020-12-20 IMAGING — RF DG UGI W/ HIGH DENSITY W/O KUB
11 of 12 series · 14 of 24 positions shown · non-contrast
Comparison: 11/10/2016

CLINICAL DATA: Difficulty swallowing

EXAM:
UPPER GI SERIES WITHOUT KUB
TECHNIQUE: Routine upper GI series was performed with dual contrast technique.
FLUOROSCOPY TIME:  Fluoroscopy Time:  4 minutes 42 seconds
Radiation Exposure Index (if provided by the fluoroscopic device):
162.4 mGy
Number of Acquired Spot Images: 20, multiple cine fluoroscopic runs

[Series 1: cp_standard · 0.17mm/px · 1 of 63 frames shown (1 of 8)]
[frame 10/63]
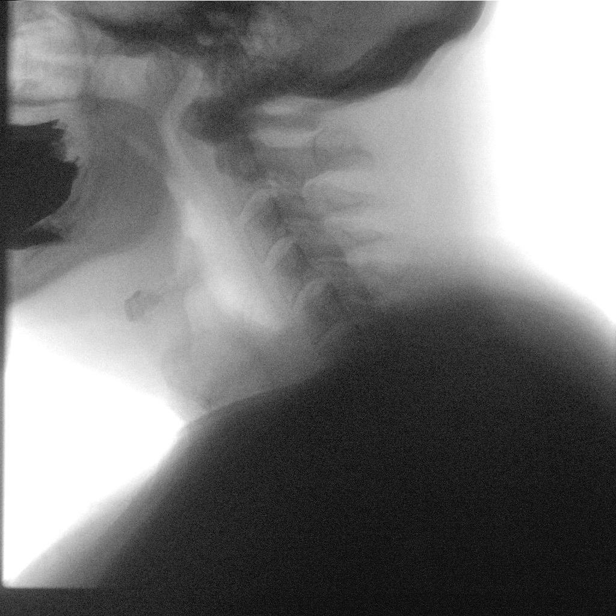

[Series 2: cp_standard · 0.17mm/px · 2 of 88 frames shown (2 of 8)]
[frame 14/88]
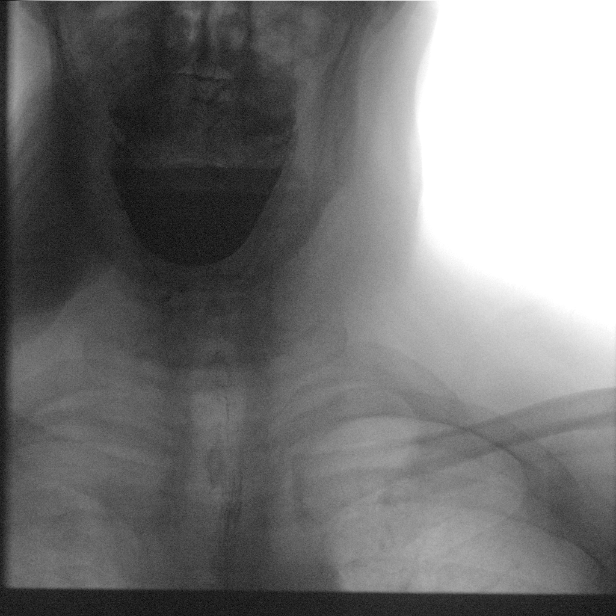
[frame 75/88]
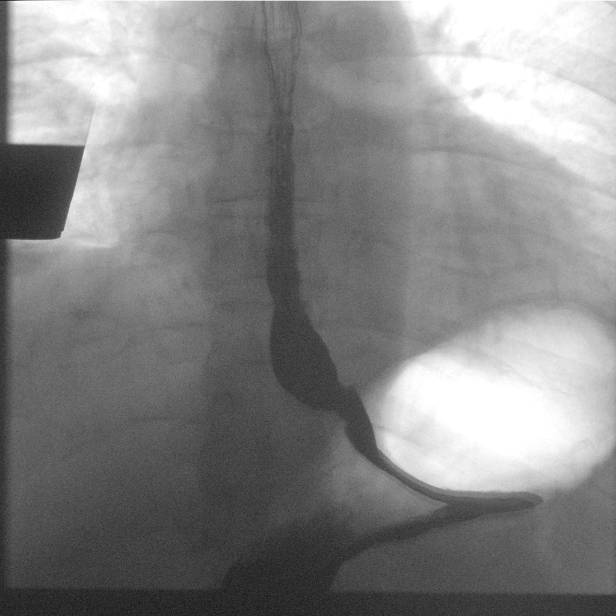

[Series 4: fluoro_barium 2fps_bw · 0.17mm/px · 1 of 8 frames shown (1 of 3)]
[frame 7/8]
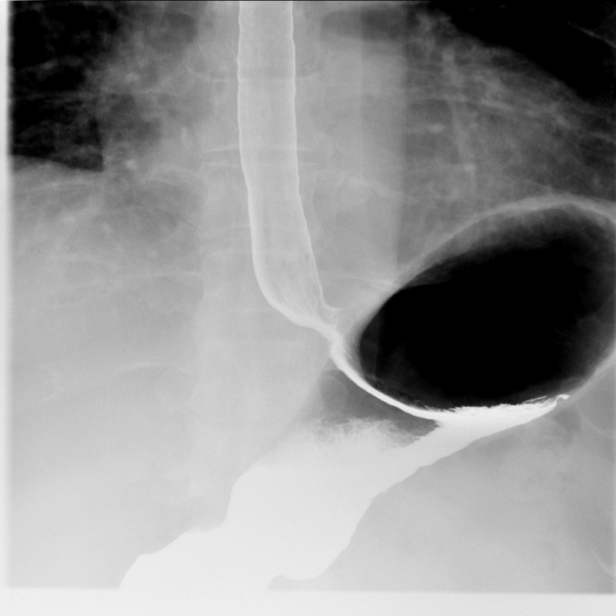

[Series 5: fluoro_barium 2fps_bw · 0.17mm/px · 1 of 1 slices shown (2 of 3)]
[im 1/1]
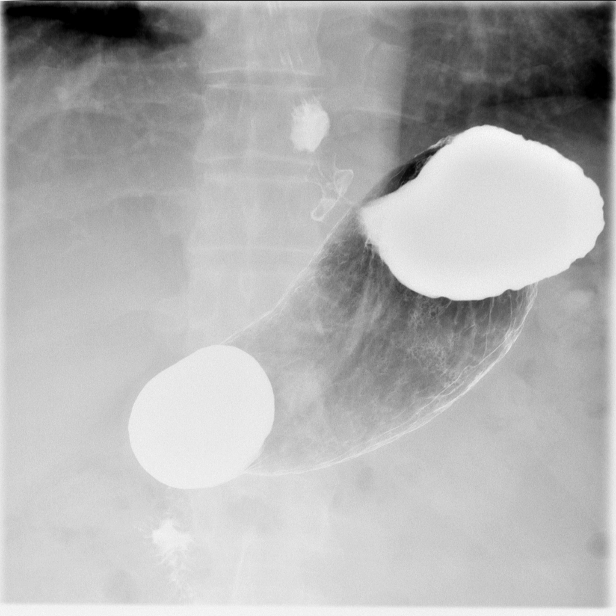

[Series 9: fluoro_barium 2fps_bw · 0.17mm/px · 1 of 1 slices shown (3 of 3)]
[im 1/1]
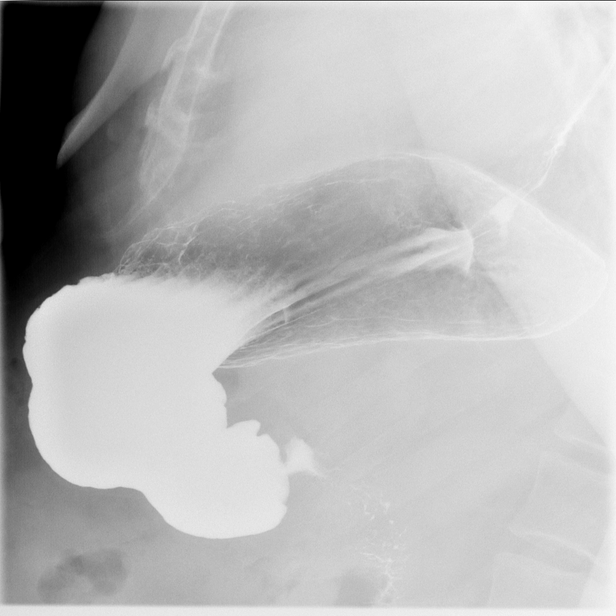

[Series 11: cp_standard · 0.17mm/px · 1 of 81 frames shown (3 of 8)]
[frame 69/81]
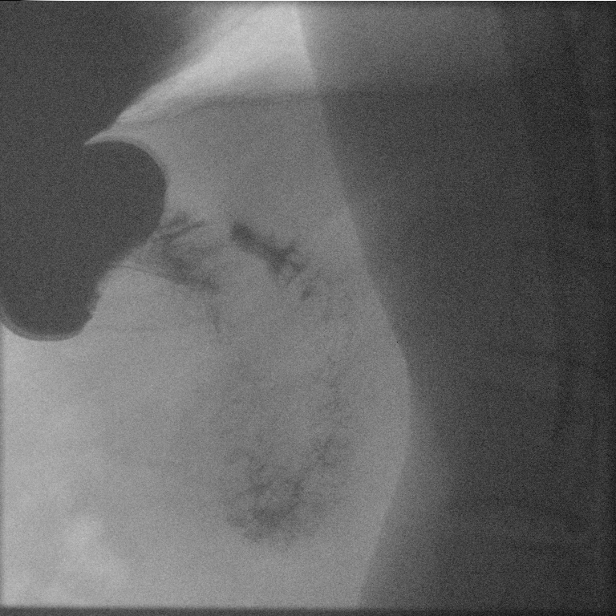

[Series 14: cp_standard · 0.17mm/px · 1 of 75 frames shown (4 of 8)]
[frame 12/75]
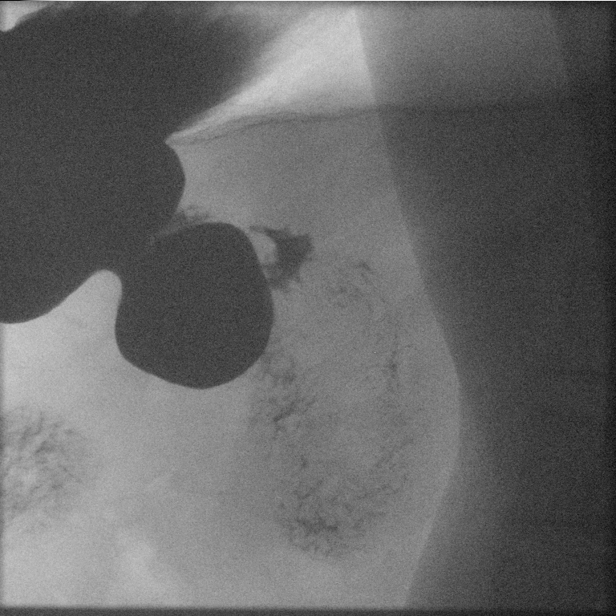

[Series 15: cp_standard · 0.17mm/px · 1 of 110 frames shown (5 of 8)]
[frame 17/110]
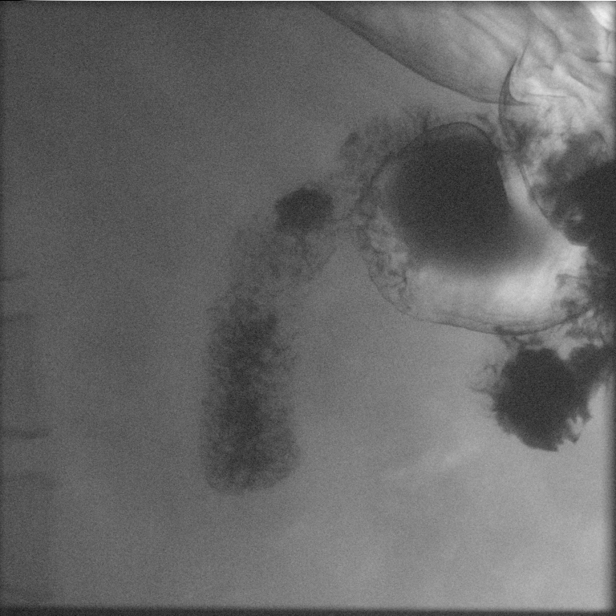

[Series 16: cp_standard · 0.17mm/px · 2 of 127 frames shown (6 of 8)]
[frame 20/127]
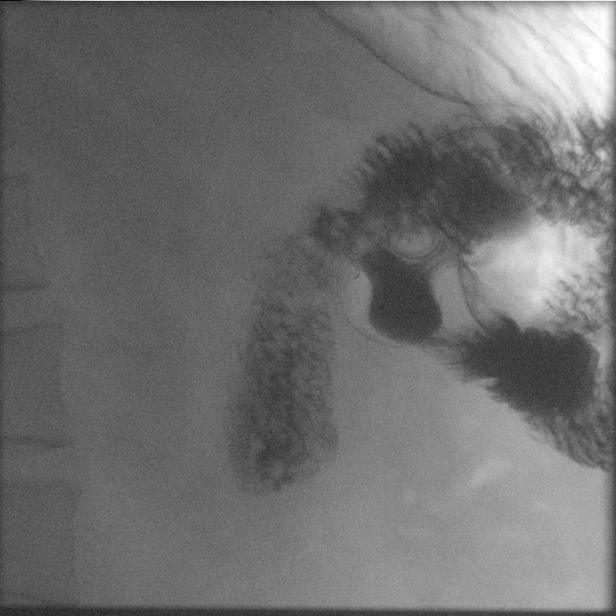
[frame 108/127]
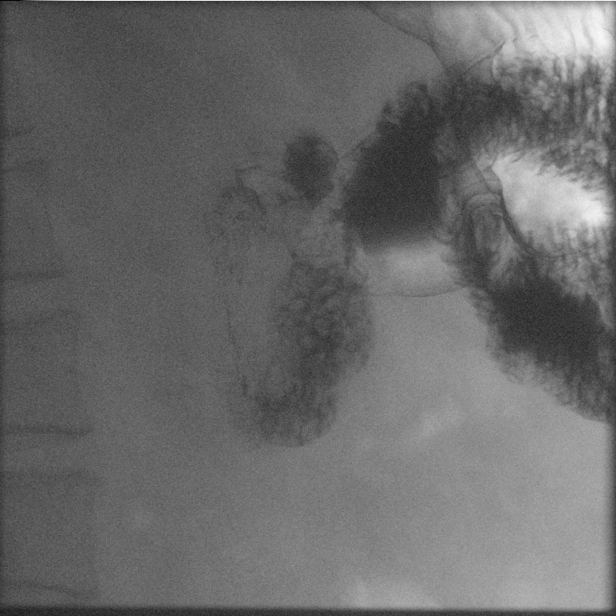

[Series 18: cp_standard · 0.17mm/px · 2 of 74 frames shown (7 of 8)]
[frame 12/74]
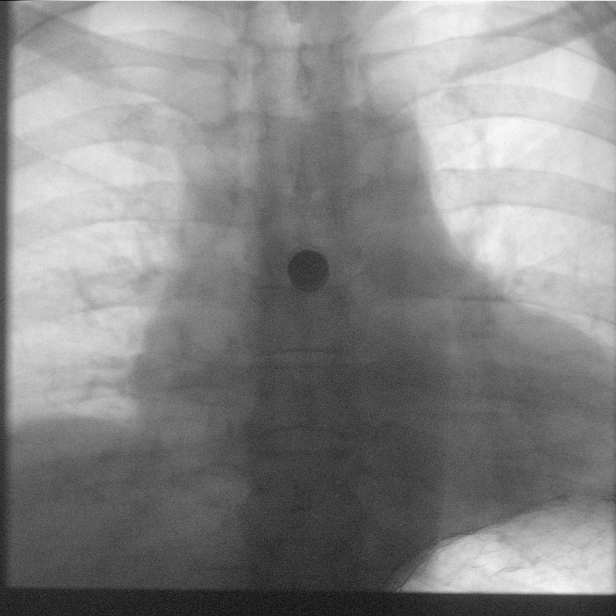
[frame 73/74]
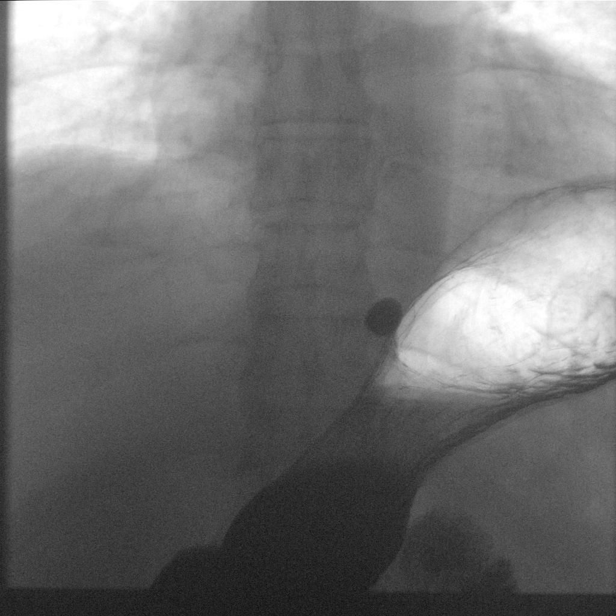

[Series 20: cp_standard · 0.26mm/px · 1 of 14 frames shown (8 of 8)]
[frame 12/14]
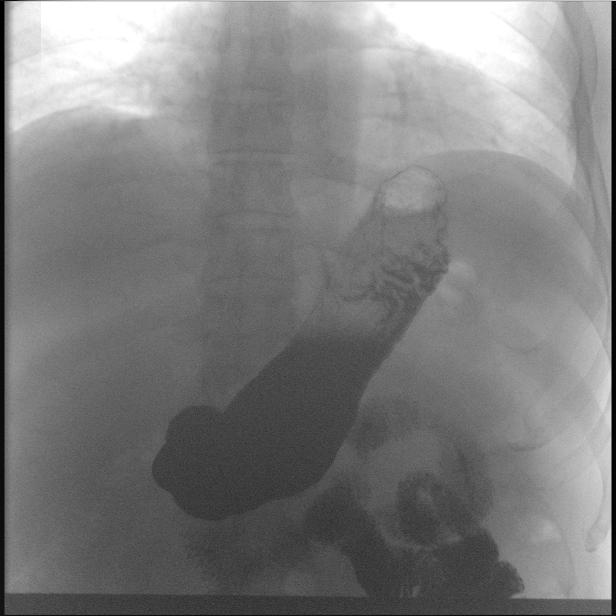

[14 of 24 positions shown; findings below may reference images not displayed]

FINDINGS: Swallowing mechanism and esophageal transit time are within normal
limits. No esophageal narrowing is seen. No significant reflux or
reflux esophagitis is noted. Small sliding-type hiatal hernia is
noted smaller than that seen on prior examination.

The stomach distends well and demonstrates no intraluminal filling
defect. No mucosal abnormality is noted. There is free flow contrast
material into the proximal small bowel. Duodenal bulb and duodenal
sweep appear within normal limits.

Barium impregnated tablet was then administered and demonstrated
some slight delay in passage below the aortic knob with slight delay
of passage at the gastroesophageal junction. This likely represents
a mild functional area of narrowing as opposed to a true mechanical
narrowing.
IMPRESSION: Small sliding-type hiatal hernia is decreased when compared with the
prior exam.

Normal swallowing mechanism and esophageal transit time.

No reflux is noted.

Slight delay of barium impregnated tablet similar to that noted on
the prior study.

## 2020-12-27 ENCOUNTER — Other Ambulatory Visit (HOSPITAL_COMMUNITY): Payer: Self-pay | Admitting: Emergency Medicine

## 2020-12-27 ENCOUNTER — Other Ambulatory Visit: Payer: Self-pay | Admitting: Emergency Medicine

## 2020-12-27 DIAGNOSIS — I739 Peripheral vascular disease, unspecified: Secondary | ICD-10-CM

## 2020-12-30 ENCOUNTER — Other Ambulatory Visit: Payer: Self-pay | Admitting: Emergency Medicine

## 2020-12-30 DIAGNOSIS — R600 Localized edema: Secondary | ICD-10-CM

## 2020-12-30 DIAGNOSIS — I739 Peripheral vascular disease, unspecified: Secondary | ICD-10-CM

## 2021-01-04 ENCOUNTER — Ambulatory Visit
Admission: RE | Admit: 2021-01-04 | Discharge: 2021-01-04 | Disposition: A | Discharge status: Home / Self Care | Payer: Managed Care, Other (non HMO) | Source: Ambulatory Visit | Attending: Emergency Medicine | Admitting: Emergency Medicine

## 2021-01-04 ENCOUNTER — Ambulatory Visit: Payer: Managed Care, Other (non HMO)

## 2021-01-04 ENCOUNTER — Other Ambulatory Visit: Payer: Self-pay

## 2021-01-04 DIAGNOSIS — R600 Localized edema: Secondary | ICD-10-CM | POA: Insufficient documentation

## 2021-01-06 ENCOUNTER — Other Ambulatory Visit: Payer: Self-pay

## 2021-01-06 ENCOUNTER — Ambulatory Visit
Admission: RE | Admit: 2021-01-06 | Discharge: 2021-01-06 | Disposition: A | Discharge status: Home / Self Care | Payer: Managed Care, Other (non HMO) | Source: Ambulatory Visit | Attending: Emergency Medicine | Admitting: Emergency Medicine

## 2021-01-06 DIAGNOSIS — I739 Peripheral vascular disease, unspecified: Secondary | ICD-10-CM | POA: Diagnosis present

## 2022-09-23 ENCOUNTER — Emergency Department
Admission: EM | Admit: 2022-09-23 | Discharge: 2022-09-23 | Disposition: A | Discharge status: Home / Self Care | Payer: Managed Care, Other (non HMO) | Attending: Student in an Organized Health Care Education/Training Program | Admitting: Student in an Organized Health Care Education/Training Program

## 2022-09-23 ENCOUNTER — Other Ambulatory Visit: Payer: Self-pay

## 2022-09-23 ENCOUNTER — Emergency Department: Payer: Managed Care, Other (non HMO)

## 2022-09-23 DIAGNOSIS — N200 Calculus of kidney: Secondary | ICD-10-CM

## 2022-09-23 DIAGNOSIS — R1032 Left lower quadrant pain: Secondary | ICD-10-CM | POA: Diagnosis present

## 2022-09-23 DIAGNOSIS — N132 Hydronephrosis with renal and ureteral calculous obstruction: Secondary | ICD-10-CM | POA: Insufficient documentation

## 2022-09-23 LAB — COMPREHENSIVE METABOLIC PANEL
ALT: 11 U/L (ref 0–44)
AST: 16 U/L (ref 15–41)
Albumin: 3.9 g/dL (ref 3.5–5.0)
Alkaline Phosphatase: 58 U/L (ref 38–126)
Anion gap: 14 (ref 5–15)
BUN: 24 mg/dL — ABNORMAL HIGH (ref 6–20)
CO2: 24 mmol/L (ref 22–32)
Calcium: 9.1 mg/dL (ref 8.9–10.3)
Chloride: 97 mmol/L — ABNORMAL LOW (ref 98–111)
Creatinine, Ser: 1.82 mg/dL — ABNORMAL HIGH (ref 0.61–1.24)
GFR, Estimated: 43 mL/min — ABNORMAL LOW (ref >60)
Glucose, Bld: 134 mg/dL — ABNORMAL HIGH (ref 70–99)
Potassium: 4.4 mmol/L (ref 3.5–5.1)
Sodium: 135 mmol/L (ref 135–145)
Total Bilirubin: 1.3 mg/dL — ABNORMAL HIGH (ref 0.3–1.2)
Total Protein: 7.2 g/dL (ref 6.5–8.1)

## 2022-09-23 LAB — CBC
HCT: 45.3 % (ref 39.0–52.0)
Hemoglobin: 15 g/dL (ref 13.0–17.0)
MCH: 29.7 pg (ref 26.0–34.0)
MCHC: 33.1 g/dL (ref 30.0–36.0)
MCV: 89.7 fL (ref 80.0–100.0)
Platelets: 252 10*3/uL (ref 150–400)
RBC: 5.05 MIL/uL (ref 4.22–5.81)
RDW: 13.6 % (ref 11.5–15.5)
WBC: 15 10*3/uL — ABNORMAL HIGH (ref 4.0–10.5)
nRBC: 0 % (ref 0.0–0.2)

## 2022-09-23 LAB — URINALYSIS, ROUTINE W REFLEX MICROSCOPIC
Bilirubin Urine: NEGATIVE
Glucose, UA: NEGATIVE mg/dL
Ketones, ur: 20 mg/dL — AB
Leukocytes,Ua: NEGATIVE
Nitrite: NEGATIVE
Protein, ur: NEGATIVE mg/dL
Specific Gravity, Urine: 1.018 (ref 1.005–1.030)
Squamous Epithelial / HPF: NONE SEEN (ref 0–5)
pH: 5 (ref 5.0–8.0)

## 2022-09-23 LAB — LIPASE, BLOOD: Lipase: 36 U/L (ref 11–51)

## 2022-09-23 MED ORDER — CEPHALEXIN 500 MG PO CAPS: 500.0000 mg | ORAL_CAPSULE | Freq: Three times a day (TID) | ORAL | 0 refills | Status: AC

## 2022-09-23 MED ORDER — MORPHINE SULFATE (PF) 4 MG/ML IV SOLN
4.0000 mg | INTRAVENOUS | Status: DC | PRN
  Administered 2022-09-23: 4 mg via INTRAVENOUS
  Filled 2022-09-23: qty 1

## 2022-09-23 MED ORDER — KETOROLAC TROMETHAMINE 30 MG/ML IJ SOLN
15.0000 mg | Freq: Once | INTRAMUSCULAR | Status: AC
  Administered 2022-09-23: 15 mg via INTRAVENOUS
  Filled 2022-09-23: qty 1

## 2022-09-23 MED ORDER — ONDANSETRON 4 MG PO TBDP: 4.0000 mg | ORAL_TABLET | Freq: Three times a day (TID) | ORAL | 0 refills | Status: DC | PRN

## 2022-09-23 MED ORDER — OXYCODONE-ACETAMINOPHEN 5-325 MG PO TABS
1.0000 | ORAL_TABLET | Freq: Once | ORAL | Status: AC
  Administered 2022-09-23: 1 via ORAL
  Filled 2022-09-23: qty 1

## 2022-09-23 MED ORDER — IOHEXOL 350 MG/ML SOLN
60.0000 mL | Freq: Once | INTRAVENOUS | Status: AC | PRN
  Administered 2022-09-23: 60 mL via INTRAVENOUS

## 2022-09-23 MED ORDER — SODIUM CHLORIDE 0.9 % IV BOLUS
1000.0000 mL | Freq: Once | INTRAVENOUS | Status: AC
  Administered 2022-09-23: 1000 mL via INTRAVENOUS

## 2022-09-23 MED ORDER — ONDANSETRON HCL 4 MG/2ML IJ SOLN
4.0000 mg | Freq: Once | INTRAMUSCULAR | Status: AC
  Administered 2022-09-23: 4 mg via INTRAVENOUS
  Filled 2022-09-23: qty 2

## 2022-09-23 MED ORDER — TAMSULOSIN HCL 0.4 MG PO CAPS: 0.4000 mg | ORAL_CAPSULE | Freq: Every day | ORAL | 0 refills | Status: DC

## 2022-09-23 MED ORDER — OXYCODONE-ACETAMINOPHEN 5-325 MG PO TABS: 1.0000 | ORAL_TABLET | ORAL | 0 refills | Status: DC | PRN

## 2022-09-23 NOTE — ED Triage Notes (Signed)
Pt comes from home via POV c/o LLQ abd pain. Pt states pain started about 4 days ago. Last BM was Tuesday morning.Pt denies N/V/ diarrhea. Denies CP, SOB

## 2022-09-23 NOTE — ED Provider Notes (Signed)
[facility] Provider Note    Event Date/Time   First MD Initiated Contact with Patient 09/23/22 [phone]     (approximate)   History   Abdominal Pain   HPI  Jose Black is a 56 y.o. male no significant past medical history does take Synthroid presents to the ER for evaluation of left lower quadrant abdominal pain has been intermittent started the beginning of this week was mild.  Last night woke up around 3 in the morning with more severe discomfort.  Has had several days without bowel movement.  Did have some flank pain.  Had some decreased urine output.  Has not moved his bowel.  Has had colonoscopy in the past 5 years that was normal.  Denies any history of kidney stone or diverticulitis.  Denies any blood in his stool no melena.     Physical Exam   Triage Vital Signs: ED Triage Vitals  Enc Vitals Group     BP 09/23/22 0516 (!) 158/79     Pulse Rate 09/23/22 0516 73     Resp 09/23/22 0516 18     Temp 09/23/22 0516 98.3 F (36.8 C)     Temp Source 09/23/22 0516 Oral     SpO2 09/23/22 0516 94 %     Weight 09/23/22 0520 220 lb (99.8 kg)     Height 09/23/22 0520 '5\' 10"'$  (1.778 m)     Head Circumference --      Peak Flow --      Pain Score 09/23/22 0520 4     Pain Loc --      Pain Edu? --      Excl. in [place]? --     Most recent vital signs: Vitals:   09/23/22 0900 09/23/22 0945  BP:    Pulse: 70 63  Resp: 19 12  Temp:    SpO2: 100% 93%     Constitutional: Alert  Eyes: Conjunctivae are normal.  Head: Atraumatic. Nose: No congestion/rhinnorhea. Mouth/Throat: Mucous membranes are moist.   Neck: Painless ROM.  Cardiovascular:   Good peripheral circulation. Respiratory: Normal respiratory effort.  No retractions.  Gastrointestinal: Soft with mild ttp in llq Musculoskeletal:  no deformity Neurologic:  MAE spontaneously. No gross focal neurologic deficits are appreciated.  Skin:  Skin is warm, dry and intact. No rash noted. Psychiatric:  Mood and affect are normal. Speech and behavior are normal.    ED Results / Procedures / Treatments   Labs (all labs ordered are listed, but only abnormal results are displayed) Labs Reviewed  COMPREHENSIVE METABOLIC PANEL - Abnormal; Notable for the following components:      Result Value   Chloride 97 (*)    Glucose, Bld 134 (*)    BUN 24 (*)    Creatinine, Ser 1.82 (*)    Total Bilirubin 1.3 (*)    GFR, Estimated 43 (*)    All other components within normal limits  CBC - Abnormal; Notable for the following components:   WBC 15.0 (*)    All other components within normal limits  URINALYSIS, ROUTINE W REFLEX MICROSCOPIC - Abnormal; Notable for the following components:   Color, Urine YELLOW (*)    APPearance HAZY (*)    Hgb urine dipstick LARGE (*)    Ketones, ur 20 (*)    Bacteria, UA RARE (*)    All other components within normal limits  URINE CULTURE  LIPASE, BLOOD        RADIOLOGY Please see  ED Course for my review and interpretation.  I personally reviewed all radiographic images ordered to evaluate for the above acute complaints and reviewed radiology reports and findings.  These findings were personally discussed with the patient.  Please see medical record for radiology report.    PROCEDURES:  Critical Care performed:   Procedures   MEDICATIONS ORDERED IN ED: Medications  morphine (PF) 4 MG/ML injection 4 mg (4 mg Intravenous Given 09/23/22 0737)  ondansetron (ZOFRAN) injection 4 mg (4 mg Intravenous Given 09/23/22 0736)  sodium chloride 0.9 % bolus 1,000 mL (0 mLs Intravenous Stopped 09/23/22 0906)  iohexol (OMNIPAQUE) 350 MG/ML injection 60 mL (60 mLs Intravenous Contrast Given 09/23/22 0752)  ketorolac (TORADOL) 30 MG/ML injection 15 mg (15 mg Intravenous Given 09/23/22 0906)  oxyCODONE-acetaminophen (PERCOCET/ROXICET) 5-325 MG per tablet 1 tablet (1 tablet Oral Given 09/23/22 0957)     IMPRESSION / MDM / ASSESSMENT AND PLAN / ED COURSE  I  reviewed the triage vital signs and the nursing notes.                              Differential diagnosis includes, but is not limited to, diverticulitis, enteritis, abscess, stone, msk strain, mass, obstruction  Patient presenting to the ER for evaluation of symptoms as described above.  Based on symptoms, risk factors and considered above differential, this presenting complaint could reflect a potentially life-threatening illness therefore the patient will be placed on continuous pulse oximetry and telemetry for monitoring.  Laboratory evaluation will be sent to evaluate for the above complaints.  Will order CT imaging for the above differential.  Will give IVF and IV morphine for pain.  Clinical Course as of 09/23/22 1027  Sat Sep 23, 2022  0810 CT imaging on my review and interpretation shows evidence of left sided kidney stone with hydro. [PR]  1026 Patient's pain improved.  He is comfortable well-appearing no SIRS criteria at this point not septic.  Does have rare bacteria but negative nitrite and leuks.  Discussed case with Dr. Erlene Quan of urology who will help arrange close outpatient follow-up.  Will place patient on oral Keflex out of an abundance of caution given report of chills with his white count.  Urine sent for culture.  We discussed return precautions.  Patient agreeable to plan. [PR]    Clinical Course User Index [PR] Merlyn Lot, MD      FINAL CLINICAL IMPRESSION(S) / ED DIAGNOSES   Final diagnoses:  Kidney stone     Rx / DC Orders   ED Discharge Orders          Ordered    oxyCODONE-acetaminophen (PERCOCET) 5-325 MG tablet  Every 4 hours PRN        09/23/22 1025    ondansetron (ZOFRAN-ODT) 4 MG disintegrating tablet  Every 8 hours PRN        09/23/22 1025    cephALEXin (KEFLEX) 500 MG capsule  3 times daily        09/23/22 1025    tamsulosin (FLOMAX) 0.4 MG CAPS capsule  Daily after supper        09/23/22 1025             Note:  This document  was prepared using Dragon voice recognition software and may include unintentional dictation errors.    Merlyn Lot, MD 09/23/22 1027

## 2022-09-25 ENCOUNTER — Telehealth: Payer: Self-pay | Admitting: *Deleted

## 2022-09-25 LAB — URINE CULTURE: Culture: NO GROWTH

## 2022-09-25 NOTE — Telephone Encounter (Signed)
Left message on cell phone VM asking pt to return my call, home phone just rang, no VM.  Will try and add to [facility] Tuesday in [place]

## 2022-09-25 NOTE — Telephone Encounter (Signed)
Pt scheduled for Tuesday in [place]

## 2022-09-25 NOTE — Telephone Encounter (Signed)
-----   Message from Hollice Espy, MD sent at 09/24/2022  8:40 AM EST ----- Regarding: Stone patient I got a message from the ER on Saturday about this gentleman with a fairly large stone.  He may be a lithotripsy candidate.  Please see if we can recommend Monday or Tuesday with any other physician providers.   Thanks!   Hollice Espy

## 2022-09-26 ENCOUNTER — Other Ambulatory Visit: Payer: Self-pay | Admitting: Urology

## 2022-09-26 ENCOUNTER — Ambulatory Visit (INDEPENDENT_AMBULATORY_CARE_PROVIDER_SITE_OTHER): Payer: Managed Care, Other (non HMO) | Admitting: Urology

## 2022-09-26 ENCOUNTER — Other Ambulatory Visit: Payer: Self-pay

## 2022-09-26 ENCOUNTER — Encounter: Payer: Self-pay | Admitting: Urology

## 2022-09-26 ENCOUNTER — Ambulatory Visit
Admission: RE | Admit: 2022-09-26 | Discharge: 2022-09-26 | Disposition: A | Discharge status: Home / Self Care | Payer: Managed Care, Other (non HMO) | Source: Ambulatory Visit | Attending: Urology

## 2022-09-26 ENCOUNTER — Ambulatory Visit
Admission: RE | Admit: 2022-09-26 | Discharge: 2022-09-26 | Disposition: A | Discharge status: Home / Self Care | Payer: Managed Care, Other (non HMO) | Attending: Urology | Admitting: Urology

## 2022-09-26 VITALS — BP 168/91 | HR 82 | Ht 69.0 in | Wt 225.0 lb

## 2022-09-26 DIAGNOSIS — N201 Calculus of ureter: Secondary | ICD-10-CM

## 2022-09-26 DIAGNOSIS — N200 Calculus of kidney: Secondary | ICD-10-CM

## 2022-09-26 MED ORDER — TAMSULOSIN HCL 0.4 MG PO CAPS: 0.4000 mg | ORAL_CAPSULE | Freq: Every day | ORAL | 0 refills | Status: AC

## 2022-09-26 MED ORDER — OXYCODONE-ACETAMINOPHEN 5-325 MG PO TABS: 1.0000 | ORAL_TABLET | Freq: Four times a day (QID) | ORAL | 0 refills | Status: DC | PRN

## 2022-09-26 NOTE — Progress Notes (Unsigned)
ESWL ORDER FORM  Expected date of procedure: 11/30  Surgeon: MD on truck  Post op standing: 2-4wk follow up w/KUB prior  Anticoagulation/Aspirin/NSAID standing order: Hold all 72 hours prior  Anesthesia standing order: MAC  VTE standing: SCD's  Dx: Left Ureteral Stone  Procedure: left Extracorporeal shock wave lithotripsy  CPT : 46950  Standing Order Set:   *NPO after mn, KUB  *NS 144m/hr, Keflex 5015mPO, Benadryl 2526mO, Valium 39m52m, Zofran 4mg 43m   Medications if other than standing orders:   NONE

## 2022-09-26 NOTE — Patient Instructions (Signed)
ESWL for Kidney Stones  Extracorporeal shock wave lithotripsy (ESWL) is a treatment that can help break up kidney stones that are too large to pass on their own.  This is a nonsurgical procedure that breaks up a kidney stone with shock waves. These shock waves pass through your body and focus on the kidney stone. They cause the kidney stone to break into smaller pieces (fragments) while it is still in the urinary tract. The fragments of stone can pass more easily out of your body in the urine. Tell a health care provider about: Any allergies you have. All medicines you are taking, including vitamins, herbs, eye drops, creams, and over-the-counter medicines. Any problems you or family members have had with anesthetic medicines. Any bleeding problems you have. Any surgeries you have had. Any medical conditions you have. Whether you are pregnant or may be pregnant. What are the risks? Your health care provider will talk with you about risks. These may include: Infection. Bleeding from the kidney. Bruising of the kidney or skin. Scarring of the kidney. This can lead to: Increased blood pressure. Poor kidney function. Return (recurrence) of kidney stones. Damage to other structures or organs. This may include the liver, colon, spleen, or pancreas. Blockage (obstruction) of the tube that carries urine from the kidney to the bladder (ureter). Failure of the kidney stone to break into fragments. What happens before the procedure? When to stop eating and drinking Follow instructions from your health care provider about what you may eat and drink. These may include: 8 hours before your procedure Stop eating most foods. Do not eat meat, fried foods, or fatty foods. Eat only light foods, such as toast or crackers. All liquids are okay except energy drinks and alcohol. 6 hours before your procedure Stop eating. Drink only clear liquids, such as water, clear fruit juice, black coffee, plain tea,  and sports drinks. Do not drink energy drinks or alcohol. 2 hours before your procedure Stop drinking all liquids. You may be allowed to take medicines with small sips of water. If you do not follow your health care provider's instructions, your procedure may be delayed or canceled. Medicines Ask your health care provider about: Changing or stopping your regular medicines. These include any diabetes medicines or blood thinners you take. Taking medicines such as aspirin and ibuprofen. These medicines can thin your blood. Do not take them unless your health care provider tells you to. Taking over-the-counter medicines, vitamins, herbs, and supplements. Tests You may have tests, such as: Blood tests. Urine tests. Imaging tests. This may include a CT scan. Surgery safety Ask your health care provider: How your surgery site will be marked. What steps will be taken to help prevent infection. These steps may include: Washing skin with a soap that kills germs. Receiving antibiotics. General instructions If you will be going home right after the procedure, plan to have a responsible adult: Take you home from the hospital or clinic. You will not be allowed to drive. Care for you for the time you are told. What happens during the procedure?  An IV will be inserted into one of your veins. You may be given: A sedative. This helps you relax. Anesthesia. This will: Numb certain areas of your body. Make you fall asleep for surgery. A water-filled cushion may be placed behind your kidney or on your abdomen. In some cases, you may be placed in a tub of lukewarm water. Your body will be positioned in a way that makes it   easier to target the kidney stone. An X-ray or ultrasound exam will be done to locate your stone. Shock waves will be aimed at the stone. If you are awake, you may feel a tapping sensation as the shock waves pass through your body. A small mesh tube (stent) may be placed in your  ureter. This will help keep urine flowing from the kidney if the fragments of the stone have been blocking the ureter. The stent will be removed at a later time by your health care provider. The procedure may vary among health care providers and hospitals. What happens after the procedure? Your blood pressure, heart rate, breathing rate, and blood oxygen level will be monitored until you leave the hospital or clinic. You may have an X-ray after the procedure to see how many of the kidney stones were broken up. This will also show how much of the stone has passed. If there are still large fragments after treatment, you may need to have a second procedure at a later time. This information is not intended to replace advice given to you by your health care provider. Make sure you discuss any questions you have with your health care provider. Document Revised: 02/16/2022 Document Reviewed: 02/16/2022 Elsevier Patient Education  [place].  ESWL for Kidney Stones, Care After The following information offers guidance on how to care for yourself after your procedure. Your health care provider may also give you more specific instructions. If you have problems or questions, contact your health care provider. What can I expect after the procedure? After the procedure, it is common to have: Some blood in your urine. This should only last for a few days. Soreness in your back, sides, or upper abdomen for a few days. Blotches or bruises on the area where the shock wave entered the skin. Pain, discomfort, or nausea when pieces (fragments) of the kidney stone move through the tube that carries urine from the kidney to the bladder (ureter). Fragments may pass soon after the procedure. They may also take up to 4-8 weeks to pass. If you have severe pain or nausea, contact your health care provider. This may be caused by a large stone that was not broken up enough. This may mean that you need more  treatment. Some pain or discomfort during urination. Some pain or discomfort in the lower abdomen or at the base of the penis. Follow these instructions at home: Medicines  Take over-the-counter and prescription medicines only as told by your health care provider. If you were prescribed antibiotics, take them as told by your health care provider. Do not stop using the antibiotic even if you start to feel better. Ask your health care provider if the medicine prescribed to you: Requires you to avoid driving or using machinery. Can cause constipation. You may need to take these actions to prevent or treat constipation: Take over-the-counter or prescription medicines. Eat foods that are high in fiber, such as beans, whole grains, and fresh fruits and vegetables. Limit foods that are high in fat and processed sugars, such as fried or sweet foods. Eating and drinking  Follow instructions from your health care provider about what you may eat and drink. You may be told to: Reduce how much salt (sodium) you eat or drink. Check ingredients and nutrition facts on packaged foods and drinks to see how much sodium they contain. Reduce how much meat you eat. Drink enough fluid to keep your urine pale yellow. This can help you pass  any pieces of the stone that are left. It can also prevent new stones from forming. Eat plenty of fresh fruits and vegetables. Eat the recommended amount of calcium for your age and gender. Ask your health care provider how much calcium you should have. Activity Get plenty of rest as told by your health care provider. Avoid sitting for a long time without moving. Get up to take short walks every 1-2 hours. This is important to improve blood flow and breathing. Ask for help if you feel weak or unsteady. Your health care provider may tell you to lie in a certain position (postural drainage) and tap firmly (percuss) over your kidney area to help stone fragments pass. Follow  instructions as told by your health care provider. Return to your normal activities as told by your health care provider. Ask your health care provider what activities are safe for you. Most people can resume normal activities 1-2 days after the procedure. General instructions If told, strain all urine through the strainer that was provided by your health care provider. Keep all fragments for your health care provider to see. Any stones that are found may be sent to a medical lab for examination. The stone may be as small as a grain of salt. Keep all follow-up visits. This is important if you had a stent placed because it may need to stay in place for a few weeks. Ask your health care provider when the stent will be removed. Contact a health care provider if: You have a fever or chills. You have severe nausea that leads to persistent vomiting. You have any of these urinary symptoms: Increased blood or blood clots in the urine. Urine that smells bad or unusual. A strong urge to urinate after emptying your bladder. Pain or burning with urination that does not go away. A continued need to urinate more often than usual. You have a stent, and it comes out. Get help right away if: You have severe pain in your back, sides, or upper abdomen. You faint. You have any of these urinary symptoms: Severe pain while urinating. More blood in your urine, or blood in your urine when you did not have any before. Blood clots in your urine larger than 1 inch (2.5 cm) in size. You pass only a small amount of urine when you urinate or are unable to pass any urine. This information is not intended to replace advice given to you by your health care provider. Make sure you discuss any questions you have with your health care provider. Document Revised: 02/16/2022 Document Reviewed: 02/16/2022 Elsevier Patient Education  [place].

## 2022-09-26 NOTE — H&P (View-Only) (Signed)
09/26/22 3:23 PM   Jose Black 1966-02-16 [id number]  CC: Left ureteral stone  HPI: 56 year old male with at least a week of intermittent left-sided lower abdominal pain, and CT on 09/23/2022 showing a 9 mm left proximal ureteral stone with upstream hydronephrosis.  He denies any prior history of kidney stones.  Pain has been moderately controlled with oxycodone.  He denies any fevers, chills, or urinary symptoms.  Urine culture showed no growth.   PMH: Past Medical History:  Diagnosis Date   Dysplastic nevi    Elevated lipids    Graves disease    Graves disease    Hypertension    Hypothyroidism    Mitral and aortic regurgitation    MVP (mitral valve prolapse)    Sinusitis    Sleep apnea    CPAP at home   Urticaria     Surgical History: Past Surgical History:  Procedure Laterality Date   COLONOSCOPY N/A 03/16/2017   Procedure: COLONOSCOPY;  Surgeon: Lollie Sails, MD;  Location: [facility] ENDOSCOPY;  Service: Endoscopy;  Laterality: N/A;   ESOPHAGOGASTRODUODENOSCOPY N/A 03/16/2017   Procedure: ESOPHAGOGASTRODUODENOSCOPY (EGD);  Surgeon: Lollie Sails, MD;  Location[facility] ENDOSCOPY;  Service: Endoscopy;  Laterality: N/A;   ESOPHAGOGASTRODUODENOSCOPY (EGD) WITH PROPOFOL N/A 05/24/2017   Procedure: ESOPHAGOGASTRODUODENOSCOPY (EGD) WITH PROPOFOL;  Surgeon: Lollie Sails, MD;  Location: [facility] ENDOSCOPY;  Service: Endoscopy;  Laterality: N/A;   iodine ablation     NASAL SINUS SURGERY     Family History: No family history on file.  Social History:  reports that he has never smoked. He has never used smokeless tobacco. He reports that he does not drink alcohol and does not use drugs.  Physical Exam: BP (!) 168/91 (BP Location: Left Arm, Patient Position: Sitting, Cuff Size: Large)   Pulse 82   Ht '5\' 9"'$  (1.753 m)   Wt 225 lb (102.1 kg)   BMI 33.23 kg/m    Constitutional:  Alert and oriented, No acute distress. Cardiovascular: No clubbing, cyanosis, or  edema. Respiratory: Normal respiratory effort, no increased work of breathing. GI: Abdomen is soft, nontender, nondistended, no abdominal masses   Laboratory Data: Reviewed, urine culture negative  Pertinent Imaging: I have personally viewed and interpreted the CT showing a 9 mm left proximal ureteral stone, 1300HU, 15cm SSD.  Assessment & Plan:   56 year old male with 9 mm left proximal ureteral stone, unchanged in location on KUB today, no clinical or laboratory evidence of infection.  We discussed various treatment options for urolithiasis including observation with or without medical expulsive therapy, shockwave lithotripsy (SWL), ureteroscopy and laser lithotripsy with stent placement, and percutaneous nephrolithotomy.  We discussed that management is based on stone size, location, density, patient co-morbidities, and patient preference.   Stones <47m in size have a >80% spontaneous passage rate. Data surrounding the use of tamsulosin for medical expulsive therapy is controversial, but meta analyses suggests it is most efficacious for distal stones between 5-135min size. Possible side effects include dizziness/lightheadedness, and retrograde ejaculation.  SWL has a lower stone free rate in a single procedure, but also a lower complication rate compared to ureteroscopy and avoids a stent and associated stent related symptoms. Possible complications include renal hematoma, steinstrasse, and need for additional treatment.  Ureteroscopy with laser lithotripsy and stent placement has a higher stone free rate than SWL in a single procedure, however increased complication rate including possible infection, ureteral injury, bleeding, and stent related morbidity. Common stent related symptoms include dysuria, urgency/frequency,  and flank pain.   After an extensive discussion of the risks and benefits of the above treatment options, the patient would like to proceed with left shockwave  lithotripsy.  We discussed at length his very dense stone that may require staged or additional procedures, he understands this risk and would like to proceed with shockwave lithotripsy.  Percocet refilled.   Nickolas Madrid, MD 09/26/2022  [facility] [address] [place], [place] [address] [phone]

## 2022-09-26 NOTE — Progress Notes (Signed)
09/26/22 3:23 PM   Jose Black 1966/07/26 [id number]  CC: Left ureteral stone  HPI: 56 year old male with at least a week of intermittent left-sided lower abdominal pain, and CT on 09/23/2022 showing a 9 mm left proximal ureteral stone with upstream hydronephrosis.  He denies any prior history of kidney stones.  Pain has been moderately controlled with oxycodone.  He denies any fevers, chills, or urinary symptoms.  Urine culture showed no growth.   PMH: Past Medical History:  Diagnosis Date   Dysplastic nevi    Elevated lipids    Graves disease    Graves disease    Hypertension    Hypothyroidism    Mitral and aortic regurgitation    MVP (mitral valve prolapse)    Sinusitis    Sleep apnea    CPAP at home   Urticaria     Surgical History: Past Surgical History:  Procedure Laterality Date   COLONOSCOPY N/A 03/16/2017   Procedure: COLONOSCOPY;  Surgeon: Lollie Sails, MD;  Location: [facility] ENDOSCOPY;  Service: Endoscopy;  Laterality: N/A;   ESOPHAGOGASTRODUODENOSCOPY N/A 03/16/2017   Procedure: ESOPHAGOGASTRODUODENOSCOPY (EGD);  Surgeon: Lollie Sails, MD;  Location: [facility] ENDOSCOPY;  Service: Endoscopy;  Laterality: N/A;   ESOPHAGOGASTRODUODENOSCOPY (EGD) WITH PROPOFOL N/A 05/24/2017   Procedure: ESOPHAGOGASTRODUODENOSCOPY (EGD) WITH PROPOFOL;  Surgeon: Lollie Sails, MD;  Location: [facility] ENDOSCOPY;  Service: Endoscopy;  Laterality: N/A;   iodine ablation     NASAL SINUS SURGERY     Family History: No family history on file.  Social History:  reports that he has never smoked. He has never used smokeless tobacco. He reports that he does not drink alcohol and does not use drugs.  Physical Exam: BP (!) 168/91 (BP Location: Left Arm, Patient Position: Sitting, Cuff Size: Large)   Pulse 82   Ht '5\' 9"'$  (1.753 m)   Wt 225 lb (102.1 kg)   BMI 33.23 kg/m    Constitutional:  Alert and oriented, No acute distress. Cardiovascular: No clubbing, cyanosis, or  edema. Respiratory: Normal respiratory effort, no increased work of breathing. GI: Abdomen is soft, nontender, nondistended, no abdominal masses   Laboratory Data: Reviewed, urine culture negative  Pertinent Imaging: I have personally viewed and interpreted the CT showing a 9 mm left proximal ureteral stone, 1300HU, 15cm SSD.  Assessment & Plan:   56 year old male with 9 mm left proximal ureteral stone, unchanged in location on KUB today, no clinical or laboratory evidence of infection.  We discussed various treatment options for urolithiasis including observation with or without medical expulsive therapy, shockwave lithotripsy (SWL), ureteroscopy and laser lithotripsy with stent placement, and percutaneous nephrolithotomy.  We discussed that management is based on stone size, location, density, patient co-morbidities, and patient preference.   Stones <63m in size have a >80% spontaneous passage rate. Data surrounding the use of tamsulosin for medical expulsive therapy is controversial, but meta analyses suggests it is most efficacious for distal stones between 5-168min size. Possible side effects include dizziness/lightheadedness, and retrograde ejaculation.  SWL has a lower stone free rate in a single procedure, but also a lower complication rate compared to ureteroscopy and avoids a stent and associated stent related symptoms. Possible complications include renal hematoma, steinstrasse, and need for additional treatment.  Ureteroscopy with laser lithotripsy and stent placement has a higher stone free rate than SWL in a single procedure, however increased complication rate including possible infection, ureteral injury, bleeding, and stent related morbidity. Common stent related symptoms include dysuria, urgency/frequency,  and flank pain.   After an extensive discussion of the risks and benefits of the above treatment options, the patient would like to proceed with left shockwave  lithotripsy.  We discussed at length his very dense stone that may require staged or additional procedures, he understands this risk and would like to proceed with shockwave lithotripsy.  Percocet refilled.   Nickolas Madrid, MD 09/26/2022  [facility] [address] [place], [place] [address] [phone]

## 2022-09-28 ENCOUNTER — Encounter: Payer: Self-pay | Admitting: Urology

## 2022-09-28 ENCOUNTER — Encounter
Admission: RE | Disposition: A | Discharge status: Home / Self Care | Payer: Self-pay | Source: Home / Self Care | Attending: Urology

## 2022-09-28 ENCOUNTER — Ambulatory Visit
Admission: RE | Admit: 2022-09-28 | Discharge: 2022-09-28 | Disposition: A | Discharge status: Home / Self Care | Payer: Managed Care, Other (non HMO) | Attending: Urology | Admitting: Urology

## 2022-09-28 DIAGNOSIS — N201 Calculus of ureter: Secondary | ICD-10-CM

## 2022-09-28 DIAGNOSIS — N132 Hydronephrosis with renal and ureteral calculous obstruction: Secondary | ICD-10-CM | POA: Diagnosis present

## 2022-09-28 HISTORY — PX: EXTRACORPOREAL SHOCK WAVE LITHOTRIPSY: SHX1557

## 2022-09-28 SURGERY — LITHOTRIPSY, ESWL
Anesthesia: Moderate Sedation | Laterality: Left

## 2022-09-28 MED ORDER — DIPHENHYDRAMINE HCL 25 MG PO CAPS: 25.0000 mg | ORAL_CAPSULE | ORAL | Status: AC

## 2022-09-28 MED ORDER — SODIUM CHLORIDE 0.9 % IV SOLN: INTRAVENOUS | Status: DC

## 2022-09-28 MED ORDER — DIPHENHYDRAMINE HCL 25 MG PO CAPS
ORAL_CAPSULE | ORAL | Status: AC
  Administered 2022-09-28: 25 mg via ORAL
  Filled 2022-09-28: qty 1

## 2022-09-28 MED ORDER — ONDANSETRON HCL 4 MG/2ML IJ SOLN
INTRAMUSCULAR | Status: AC
  Administered 2022-09-28: 4 mg via INTRAVENOUS
  Filled 2022-09-28: qty 2

## 2022-09-28 MED ORDER — OXYCODONE-ACETAMINOPHEN 5-325 MG PO TABS: 1.0000 | ORAL_TABLET | Freq: Four times a day (QID) | ORAL | 0 refills | Status: AC | PRN

## 2022-09-28 MED ORDER — DIAZEPAM 5 MG PO TABS
ORAL_TABLET | ORAL | Status: AC
  Administered 2022-09-28: 10 mg via ORAL
  Filled 2022-09-28: qty 2

## 2022-09-28 MED ORDER — CEPHALEXIN 500 MG PO CAPS
ORAL_CAPSULE | ORAL | Status: AC
  Administered 2022-09-28: 500 mg via ORAL
  Filled 2022-09-28: qty 1

## 2022-09-28 MED ORDER — CEPHALEXIN 500 MG PO CAPS: 500.0000 mg | ORAL_CAPSULE | Freq: Once | ORAL | Status: AC

## 2022-09-28 MED ORDER — ONDANSETRON HCL 4 MG/2ML IJ SOLN: 4.0000 mg | Freq: Once | INTRAMUSCULAR | Status: AC

## 2022-09-28 MED ORDER — DIAZEPAM 5 MG PO TABS: 10.0000 mg | ORAL_TABLET | ORAL | Status: AC

## 2022-09-28 NOTE — Discharge Instructions (Addendum)
See [facility] discharge instructions in chart. AMBULATORY SURGERY  DISCHARGE INSTRUCTIONS   The drugs that you were given will stay in your system until tomorrow so for the next 24 hours you should not:  Drive an automobile Make any legal decisions Drink any alcoholic beverage   You may resume regular meals tomorrow.  Today it is better to start with liquids and gradually work up to solid foods.  You may eat anything you prefer, but it is better to start with liquids, then soup and crackers, and gradually work up to solid foods.   Please notify your doctor immediately if you have any unusual bleeding, trouble breathing, redness and pain at the surgery site, drainage, fever, or pain not relieved by medication.    Additional Instructions:        Please contact your physician with any problems or Same Day Surgery at [phone], Monday through Friday 6 am to 4 pm, or [place] at [place] Main number at [phone].  

## 2022-09-28 NOTE — Interval H&P Note (Signed)
History and Physical Interval Note:  09/28/2022 12:31 PM  Jose Black  has presented today for surgery, with the diagnosis of Left Ureteral Stone.  The various methods of treatment have been discussed with the patient and family. After consideration of risks, benefits and other options for treatment, the patient has consented to  Procedure(s): EXTRACORPOREAL SHOCK WAVE LITHOTRIPSY (ESWL) (Left) as a surgical intervention.  The patient's history has been reviewed, patient examined, no change in status, stable for surgery.  I have reviewed the patient's chart and labs.  Questions were answered to the patient's satisfaction.    RRR CTAB   Hollice Espy

## 2022-10-11 ENCOUNTER — Other Ambulatory Visit: Payer: Self-pay

## 2022-10-11 DIAGNOSIS — N201 Calculus of ureter: Secondary | ICD-10-CM

## 2022-10-11 NOTE — Progress Notes (Signed)
10/12/2022 8:40 AM   [name] 1966-07-12 [id number]  Referring provider: Idelle Crouch, MD [place] [facility] [place],  [place] [address]  Chief Complaint  Patient presents with   Nephrolithiasis    HPI: Jose Black is a 56 y.o. who is status post ESWL who presents today for follow up with his wife, Jose Black.   Underwent ESWL on 09/28/2022 for a 9 mm left proximal ureteral stone with Dr. Erlene Quan.  Their postprocedural course was as expected and uneventful.   They have passed fragments. They bring in fragments for analysis.   KUB (10/12/2022) the 9 mm left proximal ureteral stone is no longer visualized on today's KUB  UA yellow clear, specific gravity 1.025, pH 5.5, 0-5 WBCs, 0-2 RBCs, 0-10 epithelial cells, mucus threads present and a few bacteria.   PMH: Past Medical History:  Diagnosis Date   Dysplastic nevi    Elevated lipids    Graves disease    Graves disease    Hypertension    Hypothyroidism    Mitral and aortic regurgitation    MVP (mitral valve prolapse)    Sinusitis    Sleep apnea    CPAP at home   Urticaria     Surgical History: Past Surgical History:  Procedure Laterality Date   COLONOSCOPY N/A 03/16/2017   Procedure: COLONOSCOPY;  Surgeon: Lollie Sails, MD;  Location: [facility] ENDOSCOPY;  Service: Endoscopy;  Laterality: N/A;   ESOPHAGOGASTRODUODENOSCOPY N/A 03/16/2017   Procedure: ESOPHAGOGASTRODUODENOSCOPY (EGD);  Surgeon: Lollie Sails, MD;  Location: [facility] ENDOSCOPY;  Service: Endoscopy;  Laterality: N/A;   ESOPHAGOGASTRODUODENOSCOPY (EGD) WITH PROPOFOL N/A 05/24/2017   Procedure: ESOPHAGOGASTRODUODENOSCOPY (EGD) WITH PROPOFOL;  Surgeon: Lollie Sails, MD;  Location: [facility] ENDOSCOPY;  Service: Endoscopy;  Laterality: N/A;   EXTRACORPOREAL SHOCK WAVE LITHOTRIPSY Left 09/28/2022   Procedure: EXTRACORPOREAL SHOCK WAVE LITHOTRIPSY (ESWL);  Surgeon: Hollice Espy, MD;  Location: [facility] ORS;  Service: Urology;   Laterality: Left;   iodine ablation     NASAL SINUS SURGERY      Home Medications:  Current Outpatient Medications on File Prior to Visit  Medication Sig Dispense Refill   azelastine (ASTELIN) 0.1 % nasal spray Place into the nose.     bisoprolol-hydrochlorothiazide (ZIAC) 5-6.25 MG tablet Take 1 tablet by mouth daily.     levothyroxine (SYNTHROID, LEVOTHROID) 200 MCG tablet Take 200 mcg by mouth daily before breakfast.     naloxone (NARCAN) nasal spray 4 mg/0.1 mL SMARTSIG:Both Nares     pantoprazole (PROTONIX) 40 MG tablet Take 40 mg daily by mouth.     predniSONE (STERAPRED UNI-PAK 21 TAB) 10 MG (21) TBPK tablet Take by mouth.     tamsulosin (FLOMAX) 0.4 MG CAPS capsule Take 1 capsule (0.4 mg total) by mouth daily after supper. 20 capsule 0   No current facility-administered medications on file prior to visit.    Allergies: No Known Allergies  Family History: No family history on file.  Social History:  reports that he has never smoked. He has never used smokeless tobacco. He reports that he does not drink alcohol and does not use drugs.  ROS: Pertinent ROS in HPI  Physical Exam: BP (!) 161/95   Pulse 73   Ht '5\' 10"'$  (1.778 m)   Wt 225 lb (102.1 kg)   BMI 32.28 kg/m   Constitutional:  Well nourished. Alert and oriented, No acute distress. HEENT: [place] AT, mask in place.   Trachea midline. Cardiovascular: No clubbing, cyanosis,  or edema. Respiratory: Normal respiratory effort, no increased work of breathing. Neurologic: Grossly intact, no focal deficits, moving all 4 extremities. Psychiatric: Normal mood and affect.  Laboratory Data: Lab Results  Component Value Date   WBC 15.0 (H) 09/23/2022   HGB 15.0 09/23/2022   HCT 45.3 09/23/2022   MCV 89.7 09/23/2022   PLT 252 09/23/2022    Lab Results  Component Value Date   CREATININE 1.82 (H) 09/23/2022    Urinalysis See HPI and Epic  I have reviewed the labs.   Pertinent Imaging: Narrative & Impression   CLINICAL DATA:  Kidney stone. Left-sided stone.   EXAM: ABDOMEN - 1 VIEW   COMPARISON:  Radiograph 09/26/2022. CT 09/23/2022   FINDINGS: Previous calcification projecting over the course of the left ureter is no longer seen, presumed passage of a ureteral stone. The punctate left intrarenal calculi on CT are not well seen by radiograph. Normal bowel gas pattern with moderate volume of colonic stool. Stable osseous structures, the bony pelvis trabecular thickening on CT is only faintly visualized by radiograph.   IMPRESSION: Previous calcification projecting over the course of the left ureter is no longer seen, presumed passage of a ureteral stone. The punctate intrarenal calculi in the left kidney on CT are not seen by radiograph.     Electronically Signed   By: Sparrow Rake M.D.   On: 10/13/2022 13:57     I have independently reviewed the films.    Assessment & Plan:    1. Left ureteral stone -Fragments sent for analysis -UA is clear -KUB negative for stones  Return in about 6 months (around 04/13/2023) for KUB/ov.  These notes generated with voice recognition software. I apologize for typographical errors.  [place], [place] [address]  [place], [place] [address] [phone]

## 2022-10-12 ENCOUNTER — Ambulatory Visit (INDEPENDENT_AMBULATORY_CARE_PROVIDER_SITE_OTHER): Payer: Managed Care, Other (non HMO) | Admitting: Urology

## 2022-10-12 ENCOUNTER — Encounter: Payer: Self-pay | Admitting: Urology

## 2022-10-12 ENCOUNTER — Ambulatory Visit
Admission: RE | Admit: 2022-10-12 | Discharge: 2022-10-12 | Disposition: A | Discharge status: Home / Self Care | Payer: Managed Care, Other (non HMO) | Source: Ambulatory Visit | Attending: Urology | Admitting: Urology

## 2022-10-12 ENCOUNTER — Ambulatory Visit
Admission: RE | Admit: 2022-10-12 | Discharge: 2022-10-12 | Disposition: A | Discharge status: Home / Self Care | Payer: Managed Care, Other (non HMO) | Attending: Urology | Admitting: Urology

## 2022-10-12 VITALS — BP 161/95 | HR 73 | Ht 70.0 in | Wt 225.0 lb

## 2022-10-12 DIAGNOSIS — N201 Calculus of ureter: Secondary | ICD-10-CM | POA: Insufficient documentation

## 2022-10-12 LAB — URINALYSIS, COMPLETE
Bilirubin, UA: NEGATIVE
Glucose, UA: NEGATIVE
Ketones, UA: NEGATIVE
Leukocytes,UA: NEGATIVE
Nitrite, UA: NEGATIVE
Protein,UA: NEGATIVE
RBC, UA: NEGATIVE
Specific Gravity, UA: 1.025 (ref 1.005–1.030)
Urobilinogen, Ur: 0.2 mg/dL (ref 0.2–1.0)
pH, UA: 5.5 (ref 5.0–7.5)

## 2022-10-12 LAB — MICROSCOPIC EXAMINATION

## 2022-10-19 ENCOUNTER — Telehealth: Payer: Self-pay | Admitting: *Deleted

## 2022-10-19 ENCOUNTER — Encounter: Payer: Self-pay | Admitting: *Deleted

## 2022-10-19 LAB — CALCULI, WITH PHOTOGRAPH (CLINICAL LAB)
Calcium Oxalate Dihydrate: 20 %
Calcium Oxalate Monohydrate: 80 %
Weight Calculi: 51 mg

## 2022-10-19 NOTE — Telephone Encounter (Signed)
-----   Message from Nori Riis, PA-C sent at 10/19/2022 11:43 AM EST ----- Please let Mr. Commins know that his stone analysis revealed a calcium oxalate stone.  I have sent a "stone diet" through [facility] .

## 2022-10-19 NOTE — Telephone Encounter (Signed)
Sent my chart message and left message on voice mail

## 2023-04-12 ENCOUNTER — Other Ambulatory Visit: Payer: Self-pay | Admitting: Family Medicine

## 2023-04-12 DIAGNOSIS — N201 Calculus of ureter: Secondary | ICD-10-CM

## 2023-04-12 NOTE — Progress Notes (Deleted)
04/13/2023 9:50 AM   Jose Black 1966/03/27 [id number]  Referring provider: Marguarite Arbour, MD [address] [facility] [place],  [place] [address]  Urological history: 1.  Nephrolithiasis -Stone composition 80% calcium oxalate monohydrate and 20% calcium oxalate dihydrate -left ESWL (08/2022)   No chief complaint on file.   HPI: Jose Black is a 57 y.o. who is status post ESWL who presents today for follow up with his wife, Jose Black.    KUB ***  PMH: Past Medical History:  Diagnosis Date   Dysplastic nevi    Elevated lipids    Graves disease    Graves disease    Hypertension    Hypothyroidism    Mitral and aortic regurgitation    MVP (mitral valve prolapse)    Sinusitis    Sleep apnea    CPAP at home   Urticaria     Surgical History: Past Surgical History:  Procedure Laterality Date   COLONOSCOPY N/A 03/16/2017   Procedure: COLONOSCOPY;  Surgeon: Christena Deem, MD;  Location: [facility] ENDOSCOPY;  Service: Endoscopy;  Laterality: N/A;   ESOPHAGOGASTRODUODENOSCOPY N/A 03/16/2017   Procedure: ESOPHAGOGASTRODUODENOSCOPY (EGD);  Surgeon: Christena Deem, MD;  Location: [facility] ENDOSCOPY;  Service: Endoscopy;  Laterality: N/A;   ESOPHAGOGASTRODUODENOSCOPY (EGD) WITH PROPOFOL N/A 05/24/2017   Procedure: ESOPHAGOGASTRODUODENOSCOPY (EGD) WITH PROPOFOL;  Surgeon: Christena Deem, MD;  Location: [facility] ENDOSCOPY;  Service: Endoscopy;  Laterality: N/A;   EXTRACORPOREAL SHOCK WAVE LITHOTRIPSY Left 09/28/2022   Procedure: EXTRACORPOREAL SHOCK WAVE LITHOTRIPSY (ESWL);  Surgeon: Vanna Scotland, MD;  Location: [facility] ORS;  Service: Urology;  Laterality: Left;   iodine ablation     NASAL SINUS SURGERY      Home Medications:  Current Outpatient Medications on File Prior to Visit  Medication Sig Dispense Refill   azelastine (ASTELIN) 0.1 % nasal spray Place into the nose.     bisoprolol-hydrochlorothiazide (ZIAC) 5-6.25 MG tablet Take 1 tablet by mouth  daily.     levothyroxine (SYNTHROID, LEVOTHROID) 200 MCG tablet Take 200 mcg by mouth daily before breakfast.     naloxone (NARCAN) nasal spray 4 mg/0.1 mL SMARTSIG:Both Nares     pantoprazole (PROTONIX) 40 MG tablet Take 40 mg daily by mouth.     predniSONE (STERAPRED UNI-PAK 21 TAB) 10 MG (21) TBPK tablet Take by mouth.     tamsulosin (FLOMAX) 0.4 MG CAPS capsule Take 1 capsule (0.4 mg total) by mouth daily after supper. 20 capsule 0   No current facility-administered medications on file prior to visit.    Allergies: No Known Allergies  Family History: No family history on file.  Social History:  reports that he has never smoked. He has never used smokeless tobacco. He reports that he does not drink alcohol and does not use drugs.  ROS: Pertinent ROS in HPI  Physical Exam: There were no vitals taken for this visit.  Constitutional:  Well nourished. Alert and oriented, No acute distress. HEENT: [place] AT, moist mucus membranes.  Trachea midline Cardiovascular: No clubbing, cyanosis, or edema. Respiratory: Normal respiratory effort, no increased work of breathing. GU: No CVA tenderness.  No bladder fullness or masses.  Patient with circumcised/uncircumcised phallus. ***Foreskin easily retracted***  Urethral meatus is patent.  No penile discharge. No penile lesions or rashes. Scrotum without lesions, cysts, rashes and/or edema.  Testicles are located scrotally bilaterally. No masses are appreciated in the testicles. Left and right epididymis are normal. Rectal: Patient with  normal sphincter tone. Anus and perineum  without scarring or rashes. No rectal masses are appreciated. Prostate is approximately *** grams, *** nodules are appreciated. Seminal vesicles are normal. Neurologic: Grossly intact, no focal deficits, moving all 4 extremities. Psychiatric: Normal mood and affect.  Laboratory Data: N/A   Pertinent Imaging: KUB ***  I have independently reviewed the films.    Assessment  & Plan:    1.  Nephrolithiasis - KUB ***  2. Prostate cancer screening *** No follow-ups on file.  These notes generated with voice recognition software. I apologize for typographical errors.  Jose Black  [facility] [address]  [address] [place], [place] [address] [phone]

## 2023-04-13 ENCOUNTER — Ambulatory Visit: Payer: Managed Care, Other (non HMO) | Admitting: Urology
